# Patient Record
Sex: Male | Born: 1990 | Hispanic: Yes | Smoking: Never smoker
Health system: Southern US, Community
[De-identification: ages and names within clinical notes are randomized; demographics above are authoritative.]

## PROBLEM LIST (undated history)

## (undated) DIAGNOSIS — J45909 Unspecified asthma, uncomplicated: Secondary | ICD-10-CM

## (undated) HISTORY — PX: BLADDER STONE REMOVAL: SHX568

---

## 2011-12-05 ENCOUNTER — Ambulatory Visit: Payer: Self-pay | Admitting: Family Medicine

## 2012-01-20 ENCOUNTER — Ambulatory Visit: Payer: Self-pay

## 2012-01-20 LAB — COMPREHENSIVE METABOLIC PANEL
Alkaline Phosphatase: 292 U/L — ABNORMAL HIGH (ref 50–136)
BUN: 9 mg/dL (ref 7–18)
Chloride: 99 mmol/L (ref 98–107)
Creatinine: 0.96 mg/dL (ref 0.60–1.30)
EGFR (African American): >60
EGFR (Non-African Amer.): >60
Osmolality: 274 (ref 275–301)
SGPT (ALT): 530 U/L — ABNORMAL HIGH
Sodium: 138 mmol/L (ref 136–145)
Total Protein: 8.1 g/dL (ref 6.4–8.2)

## 2012-01-20 LAB — CBC WITH DIFFERENTIAL/PLATELET
Basophil #: 0 10*3/uL (ref 0.0–0.1)
HCT: 48.2 % (ref 40.0–52.0)
Lymphocyte #: 0.8 10*3/uL — ABNORMAL LOW (ref 1.0–3.6)
MCH: 30.5 pg (ref 26.0–34.0)
MCHC: 33 g/dL (ref 32.0–36.0)
MCV: 92 fL (ref 80–100)
Platelet: 182 10*3/uL (ref 150–440)
RBC: 5.21 10*6/uL (ref 4.40–5.90)
RDW: 13.2 % (ref 11.5–14.5)

## 2012-01-20 LAB — URINALYSIS, COMPLETE
Bacteria: NEGATIVE
Glucose,UR: NEGATIVE mg/dL (ref 0–75)
Leukocyte Esterase: NEGATIVE
Nitrite: NEGATIVE
Ph: 5 (ref 4.5–8.0)
Specific Gravity: 1.025 (ref 1.003–1.030)
Squamous Epithelial: NONE SEEN

## 2012-01-20 LAB — LIPASE, BLOOD: Lipase: 155 U/L (ref 73–393)

## 2012-01-21 ENCOUNTER — Ambulatory Visit: Payer: Self-pay | Admitting: Internal Medicine

## 2012-01-21 ENCOUNTER — Ambulatory Visit: Payer: Self-pay

## 2012-01-21 ENCOUNTER — Inpatient Hospital Stay: Payer: Self-pay | Admitting: General Surgery

## 2012-01-21 LAB — COMPREHENSIVE METABOLIC PANEL
Albumin: 3.9 g/dL (ref 3.4–5.0)
Alkaline Phosphatase: 290 U/L — ABNORMAL HIGH (ref 50–136)
Bilirubin,Total: 2.5 mg/dL — ABNORMAL HIGH (ref 0.2–1.0)
Calcium, Total: 9.3 mg/dL (ref 8.5–10.1)
Creatinine: 0.98 mg/dL (ref 0.60–1.30)
EGFR (Non-African Amer.): >60
Osmolality: 276 (ref 275–301)
SGOT(AST): 210 U/L — ABNORMAL HIGH (ref 15–37)
Sodium: 139 mmol/L (ref 136–145)
Total Protein: 7.6 g/dL (ref 6.4–8.2)

## 2012-01-21 LAB — LIPASE, BLOOD: Lipase: 266 U/L (ref 73–393)

## 2012-01-21 LAB — CBC WITH DIFFERENTIAL/PLATELET
Basophil #: 0 10*3/uL (ref 0.0–0.1)
Eosinophil #: 0.2 10*3/uL (ref 0.0–0.7)
Lymphocyte %: 26.1 %
MCH: 30.8 pg (ref 26.0–34.0)
Monocyte #: 0.5 10*3/uL (ref 0.0–0.7)
Neutrophil %: 53.6 %
RDW: 13.5 % (ref 11.5–14.5)
WBC: 3.6 10*3/uL — ABNORMAL LOW (ref 3.8–10.6)

## 2012-01-21 LAB — PROTIME-INR: Prothrombin Time: 11.9 secs (ref 11.5–14.7)

## 2012-01-23 LAB — HEPATIC FUNCTION PANEL A (ARMC)
Albumin: 3.3 g/dL — ABNORMAL LOW (ref 3.4–5.0)
Alkaline Phosphatase: 216 U/L — ABNORMAL HIGH (ref 50–136)
SGOT(AST): 383 U/L — ABNORMAL HIGH (ref 15–37)
SGPT (ALT): 672 U/L — ABNORMAL HIGH
Total Protein: 6.6 g/dL (ref 6.4–8.2)

## 2012-04-18 ENCOUNTER — Ambulatory Visit: Payer: Self-pay | Admitting: Medical

## 2012-08-02 ENCOUNTER — Ambulatory Visit: Payer: Self-pay | Admitting: Internal Medicine

## 2012-10-31 ENCOUNTER — Ambulatory Visit: Payer: Self-pay | Admitting: Family Medicine

## 2013-08-23 ENCOUNTER — Ambulatory Visit: Payer: Self-pay | Admitting: Physician Assistant

## 2014-02-25 ENCOUNTER — Emergency Department: Payer: Self-pay | Admitting: Internal Medicine

## 2015-02-05 NOTE — Consult Note (Signed)
PATIENT NAME:  Troy Pittman, Troy Pittman MR#:  [id number] DATE OF BIRTH:  05-29-1991  DATE OF CONSULTATION:  01/22/2012  REFERRING PHYSICIAN:  Duwaine Maxin, MD  CONSULTING PHYSICIAN:  Lurline Del, MD  REASON FOR CONSULTATION: Choledocholithiasis.   HISTORY OF PRESENT ILLNESS: The patient is a 24 year old male who has been in his normal state of health until a few days ago when he started to have sudden onset of upper abdominal pain. It appears the patient has had some episodes of abdominal pain in the past as well but each one of them was short-lived and resolved on its own. Lately the patient has been having weakness as well as generalized body ache. He has been noticing his urine to be dark. He went to Urgent Care where an ultrasound was done which showed dilated common bile duct as well as what appears to be a filling defect in the distal common bile duct raising concerns about possible impacted stone. The patient was seen by Dr. Anda Kraft in his office and was admitted for further management. The patient was evaluated yesterday in the presence of his mother. He was feeling well at that point without significant abdominal pain. He appears to be mildly jaundiced. Denies any fever or chills.   PAST MEDICAL HISTORY: Fairly unremarkable.   ALLERGIES: Penicillin.   CURRENT MEDICATIONS:  1. Tylenol.  2. Cefazolin.   3. Morphine. 4. Promethazine.   FAMILY HISTORY: Unremarkable.   REVIEW OF SYSTEMS: Negative for fever, chills, or night sweats. Positive for intermittent abdominal pain as well as generalized body ache and muscle weakness. Denies any vomiting. Review of systems is also positive for dark urine but no change in the color of his stools.     PHYSICAL EXAMINATION:   GENERAL: Very well built male who does not appear to be in any acute distress, fully awake, alert and oriented.   VITAL SIGNS: Afebrile with a temperature around 98.7, blood pressure 120/80.   HEENT: Unremarkable except  for very mild jaundice.   NECK: Neck veins are flat.   LUNGS: Grossly clear to auscultation bilaterally with fair air entry. No added sounds.   CARDIOVASCULAR: Regular rate and rhythm. No gallops or murmur.   ABDOMEN: Abdomen is fairly benign. No significant right upper quadrant or epigastric tenderness was noted. Bowel sounds are positive. No hepatomegaly was noted as well.   EXTREMITIES: No edema.   NEUROLOGIC: Appears to be unremarkable.   LABORATORY, DIAGNOSTIC, AND RADIOLOGICAL DATA: White cell count on admission 6.8; today 3.6, hemoglobin 15.9, hematocrit 48. Chemistries are fine although his liver enzymes are abnormal with total bilirubin 2.5, alkaline phosphatase 290, ALT 546, AST 210. Serum amylase and lipase are normal. Ultrasound of the abdomen as above.   ASSESSMENT AND PLAN: The patient is with abnormal liver enzymes, intermittent abdominal pain, mild jaundice and what appears to be a stone in the distal common bile duct on ultrasound. I agree with current management with intravenous antibiotics. An ERCP was recommended and agreed upon by the patient and his mother. The patient underwent an ERCP today where a large stone was noted in the distal common bile duct. A generous sphincterotomy was made and stone was extracted. The patient tolerated the procedure well. The patient has been re-evaluated a couple of hours after the procedure. Denies any abdominal pain, nausea or vomiting.   RECOMMENDATIONS:  1. Continue antibiotics. 2. Repeat liver enzymes in a.m.  3. I will start him on a clear liquid diet today which can be gradually  advanced if he remains asymptomatic.  4. I am not sure if the patient would require a cholecystectomy as no other stones were noted within the gallbladder. I will leave this decision up to Dr. Anda KraftMarterre.  5. Further recommendations to follow.   ____________________________ Lurline DelShaukat Diksha Tagliaferro, MD si:drc D: 01/22/2012 18:12:26 ET T: 01/23/2012 10:14:07  ET JOB#: [id number]  cc: Lurline DelShaukat Lijah Bourque, MD, <Dictator> Lurline DelSHAUKAT Ruvim Risko MD ELECTRONICALLY SIGNED 01/24/2012 16:33

## 2015-02-05 NOTE — Discharge Summary (Signed)
PATIENT NAME:  Logan BoresCHECO, Vashaun H MR#:  [id number] DATE OF BIRTH:  [birth year]  DATE OF ADMISSION:  01/21/2012 DATE OF DISCHARGE:  01/23/2012  DISCHARGE DIAGNOSIS: Biliary obstruction secondary to common bile duct stone.  CLINICAL NOTE: This 24 year old male was well until three days prior to admission when he developed pain in the epigastrium. Associated with this was a general overall sense of myalgias and weakness. Over the next 24 hours he noted dark urine and nausea. Recent ultrasound showed evidence of a common bile duct stone, biliary dilatation, and laboratory studies showing an elevated total bilirubin of 2.5. He was admitted with the idea that a common duct stone was producing his symptoms.   The patient was seen in consultation by Lurline DelShaukat Iftikhar, MD from the GI service. He subsequently underwent an ERCP on 01/22/2012 which time a common bile duct stone was retrieved. Of note, on preoperative ultrasound and on ERCP images no evidence of gallstones was seen.   The patient remained asymptomatic postadmission and postprocedure. He was able to tolerate clear liquids the evening of surgery and was advanced to a soft diet which he tolerated without difficulty.   Preprocedure the bilirubin was 2.5, postprocedure 1.5 with a direct component of 0.7. There was a persistent mild elevation of serum transaminases and alkaline phosphatase postprocedure. The patient, however, was asymptomatic and felt to be a candidate for discharge home.   Arrangements were made for outpatient follow-up in six weeks with a repeat ultrasound to determine whether elective cholecystectomy would be of benefit.   ____________________________ Earline MayotteJeffrey W. Byrnett, MD jwb:drc D: 02/05/2012 10:09:57 ET T: 02/05/2012 10:54:12 ET JOB#: [id number]  cc: Earline MayotteJeffrey W. Byrnett, MD, <Dictator>, Lurline DelShaukat Iftikhar, MD, Reginold AgentEugene H. Thurmond ButtsWade, MD JEFFREY Brion AlimentW BYRNETT MD ELECTRONICALLY SIGNED 02/06/2012 16:18

## 2015-02-05 NOTE — Consult Note (Signed)
Brief Consult Note: Diagnosis: CBD stone.   Orders entered.   Discussed with Attending MD.   Comments: Ptaient with episodic abdominal pain, CBD stone on US and abnormal LFT's along with dilated biliary tree. No signs of cholangitis.  Recommendations: Agree with IV abx. ERCP with stone extraction or stent placement. The procedure was discussed with him and his mother in detail. The complications associated with the procedure including but not limited to post ERCP pancreatitis were discussed as well along with the fact that some cases of pancreatitis may be fatal. Limitations of the procedure as well as alternatives, such as surgery, were discussed as well. They are in full agreement. Will proceed with the ERCP tomorrow.  Electronic Signatures: Lurline DelIftikhar, Layden Caterino (MD)  (Signed 09-Apr-13 23:17)  Authored: Brief Consult Note   Last Updated: 09-Apr-13 23:17 by Lurline DelIftikhar, Gunther Zawadzki (MD)

## 2015-02-05 NOTE — H&P (Signed)
PATIENT NAME:  Troy BoresCHECO, [name] MR#:  [id number] DATE OF BIRTH:  1991-05-19  DATE OF ADMISSION:  01/21/2012  ADMISSION DIAGNOSIS: Choledocholithiasis.   CLINICAL NOTE: This 24 year old male was in his usual state of health until 04/06 when he experienced the onset of pain in the epigastrium. This passed rapidly but during this time he felt overall myalgias and weakness. At this time he first noted that his urine had become dark in color. Nausea has been intermittent since that time. He was evaluated at the urgent care center yesterday and noted to have mild elevation of his liver functions. An ultrasound was arranged for today showing evidence of choledocholithiasis. He was referred for evaluation. His examination was initially completed in the office in the company of his mother, Troy Pittman.  The patient reports that he has had intermittent episodes of similar pain, most recently in March of this year when it lasted 1 to 2 days, again associated with dark urine and in July 2012. Apparently other episodes have occurred in the past. He has not appreciated any fever or chills. He reports no change in his bowel function.   PHYSICAL EXAMINATION:  HEAD AND NECK: Examination showed mild scleral icterus. Mucous membranes were dry. No cervical adenopathy or thyromegaly.   CHEST: Clear to auscultation.   CARDIAC: Regular rhythm without murmur or gallop.   ABDOMEN: The abdomen was nondistended. Bowel sounds were normoactive. No bruits noted. Moderate tenderness in the epigastrium and right upper quadrant was appreciated. No peritoneal irritation, guarding, or referred pain. No inguinal adenopathy. No lower extremity edema.   LABORATORY, DIAGNOSTIC AND RADIOLOGICAL DATA: Laboratory studies obtained yesterday showed a normal white count and differential. Follow up today showed a moderate neutropenia. Hemoglobin unchanged at 15.9. Serum transaminases are elevated with an SGOT of 169 rising to 210  today and a fairly stable SGPT at 530, rising to 546. Alkaline phosphatase is modestly elevated at 290. Bilirubin has increased from yesterday's value of 1.6 to 2.5 today. Clotting studies are normal. Urinalysis shows bilirubin within the urine with specific gravity of 1.025. Ultrasound showed evidence of choledocholithiasis with dilatation of the common bile duct and intrahepatic ducts. No stones were identified within the gallbladder.   ASSESSMENT AND PLAN: The young man's general health is excellent. His intermittent episodes of abdominal pain are likely secondary to choledocholithiasis. At this time there is no evidence of gallbladder stones.   Consultation has been obtained with Dr. Niel HummerIftikhar for ERCP and stone extraction.   The patient will be placed on antibiotics pending decompression of the bile duct.   ____________________________ Earline MayotteJeffrey W. Charlesetta Milliron, MD jwb:cms D: 01/21/2012 20:23:17 ET T: 01/22/2012 06:19:22 ET JOB#: [id number]  cc: Earline MayotteJeffrey W. Jamespaul Secrist, MD, <Dictator> Lurline DelShaukat Iftikhar, MD Reginold AgentEugene H. Thurmond ButtsWade, MD Porter Moes Brion AlimentW Vang Kraeger MD ELECTRONICALLY SIGNED 01/24/2012 10:20

## 2016-02-21 IMAGING — CR DG SHOULDER 3+V*L*
1 series · 4 of 4 positions shown · non-contrast
Comparison: None.

CLINICAL DATA: Pain secondary to repetitive motion/lifting, now
painful 12 plaque force none

EXAM:
DG SHOULDER 3+VIEWS LEFT

[Series 1: w shoulder grashey left · 0.14mm/px · 4 of 4 slices shown]
[im 1/4]
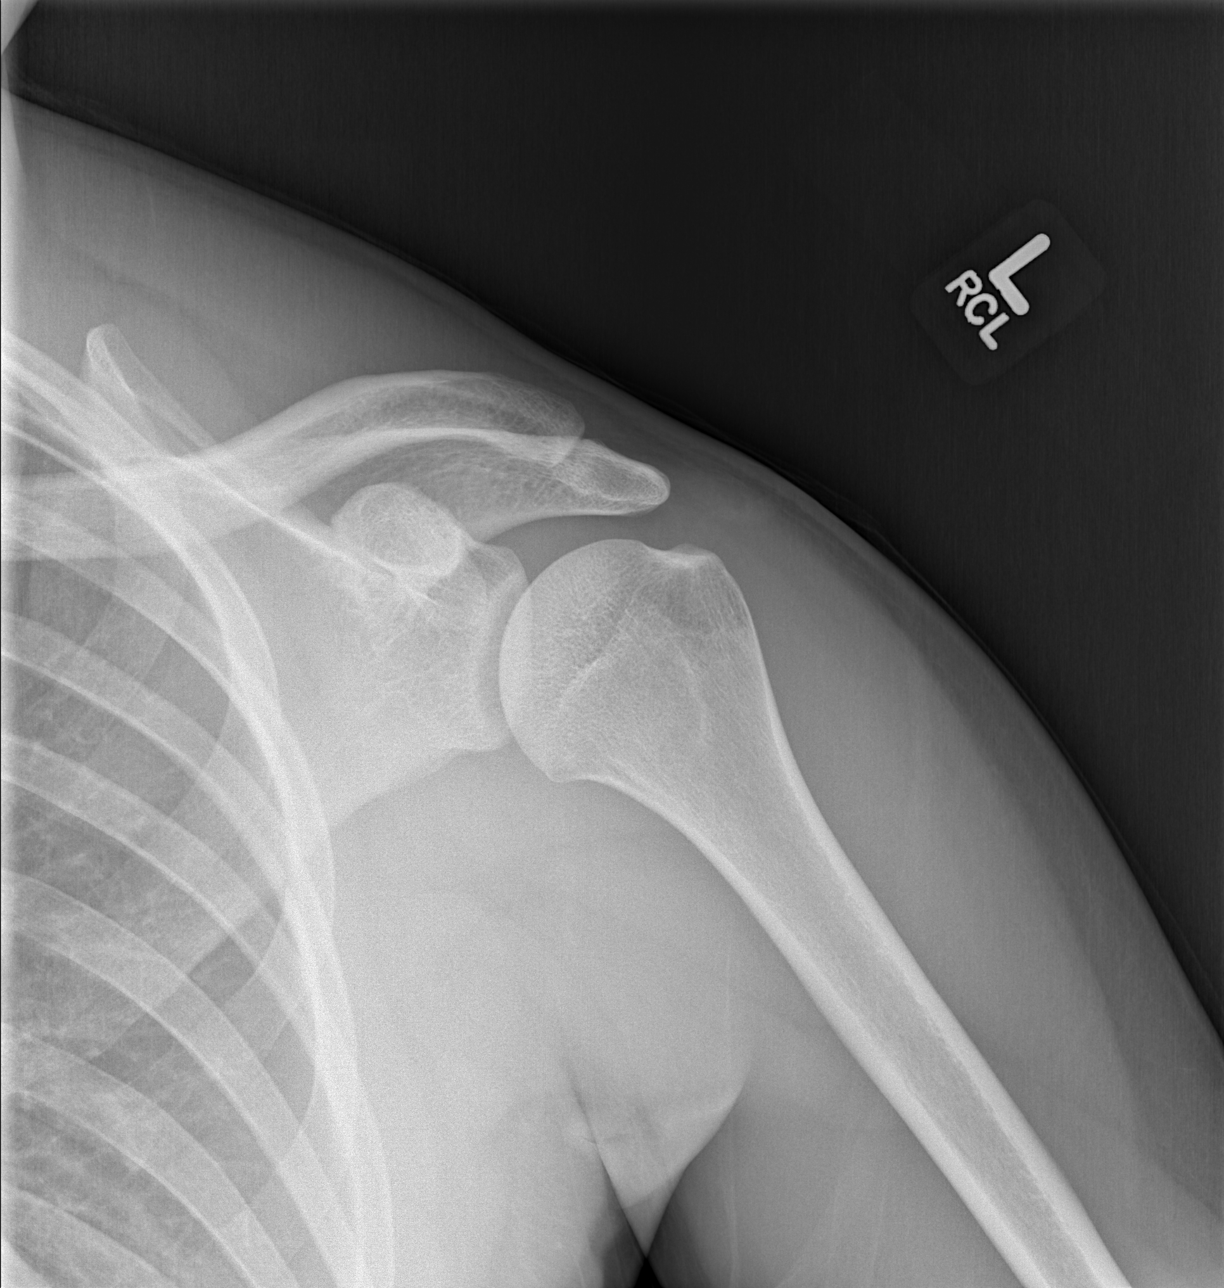
[im 2/4]
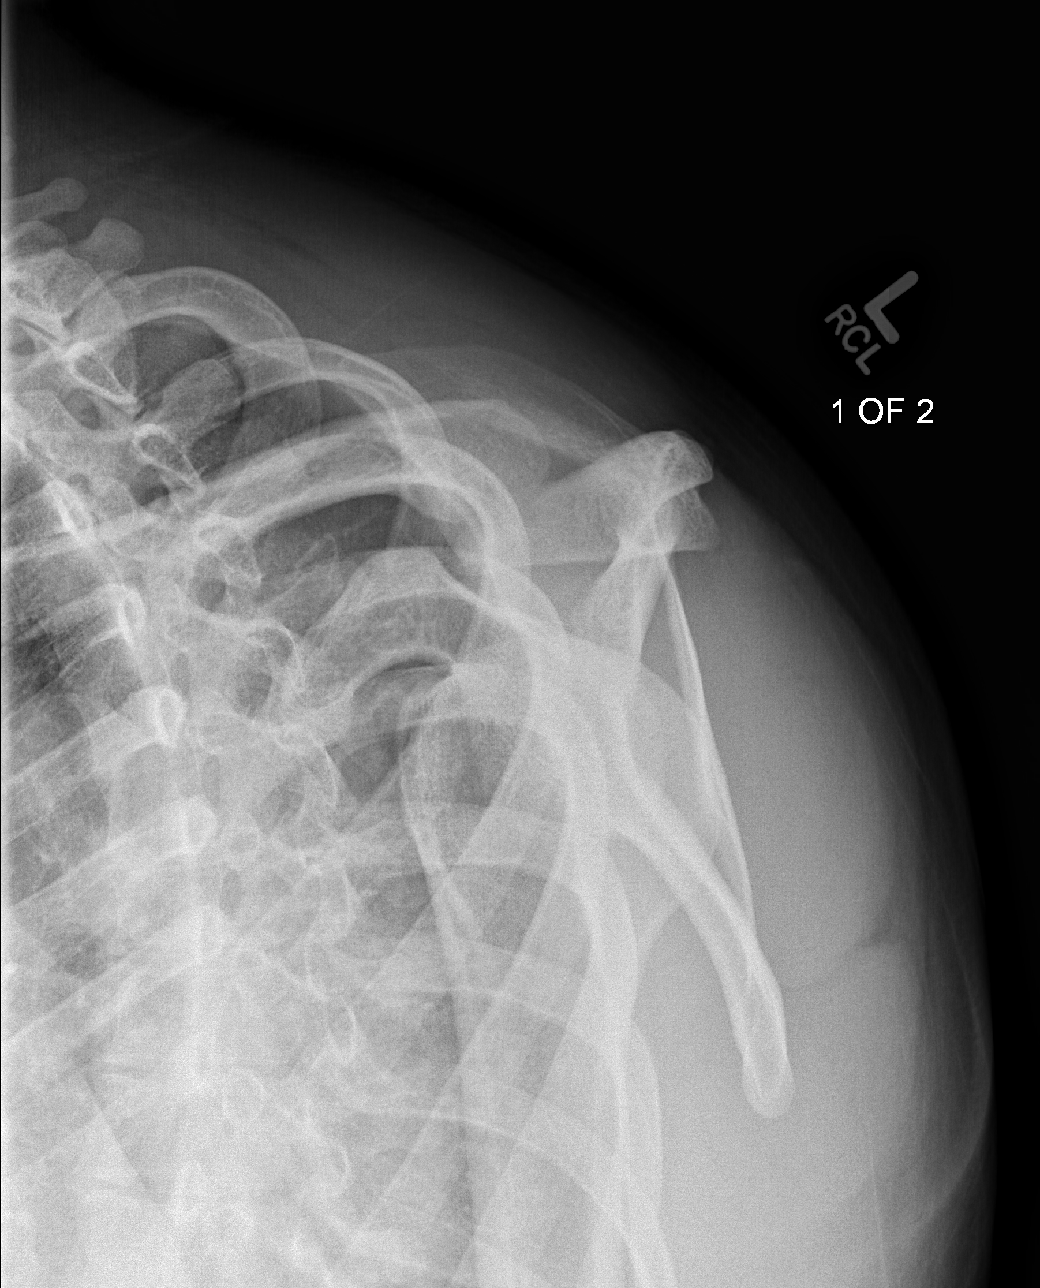
[im 3/4]
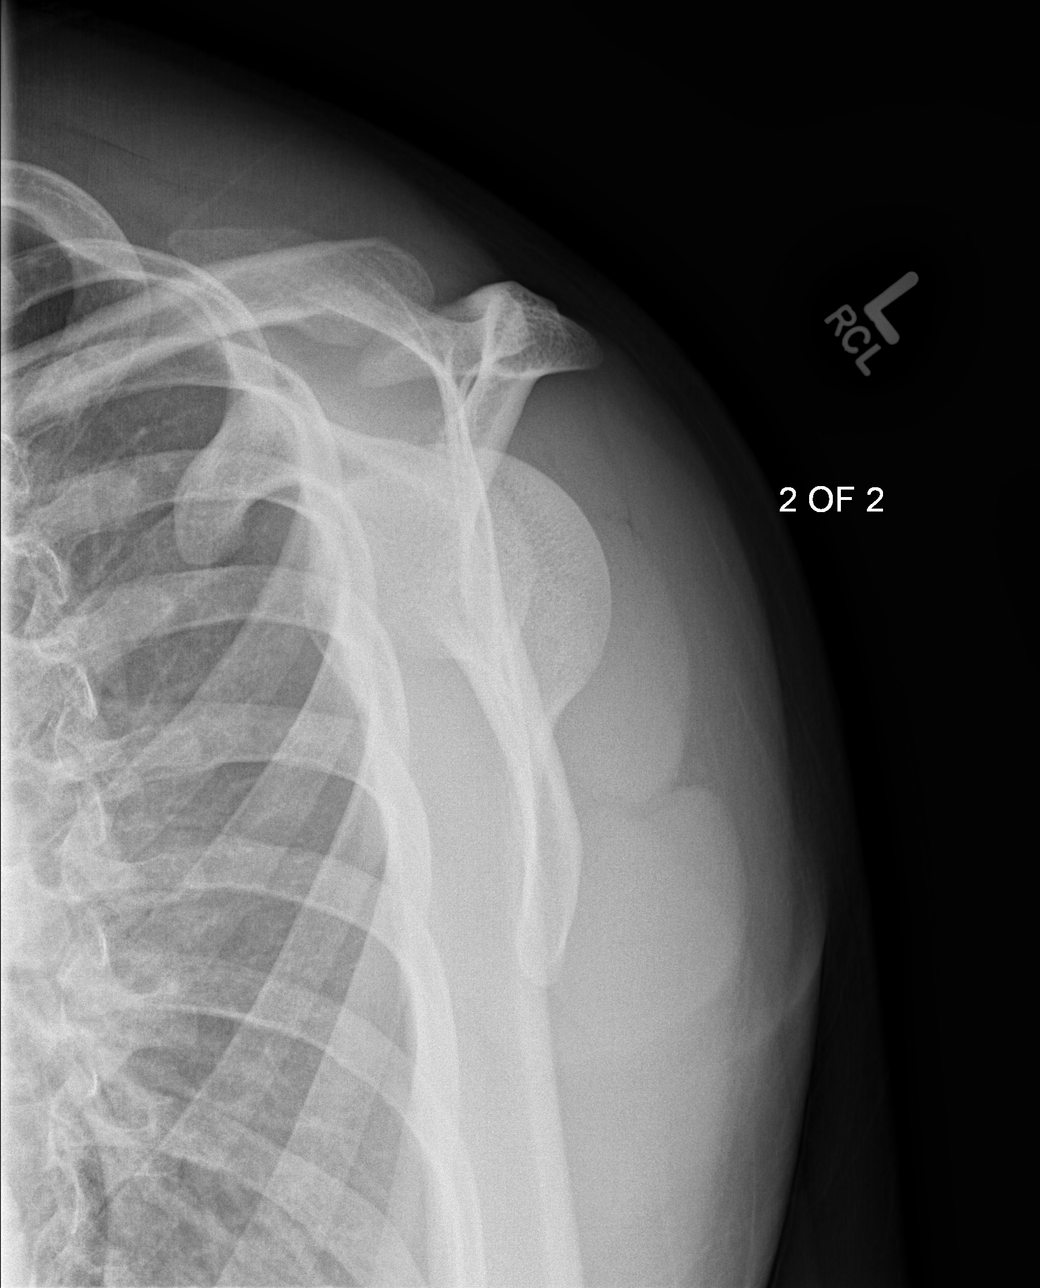
[im 4/4]
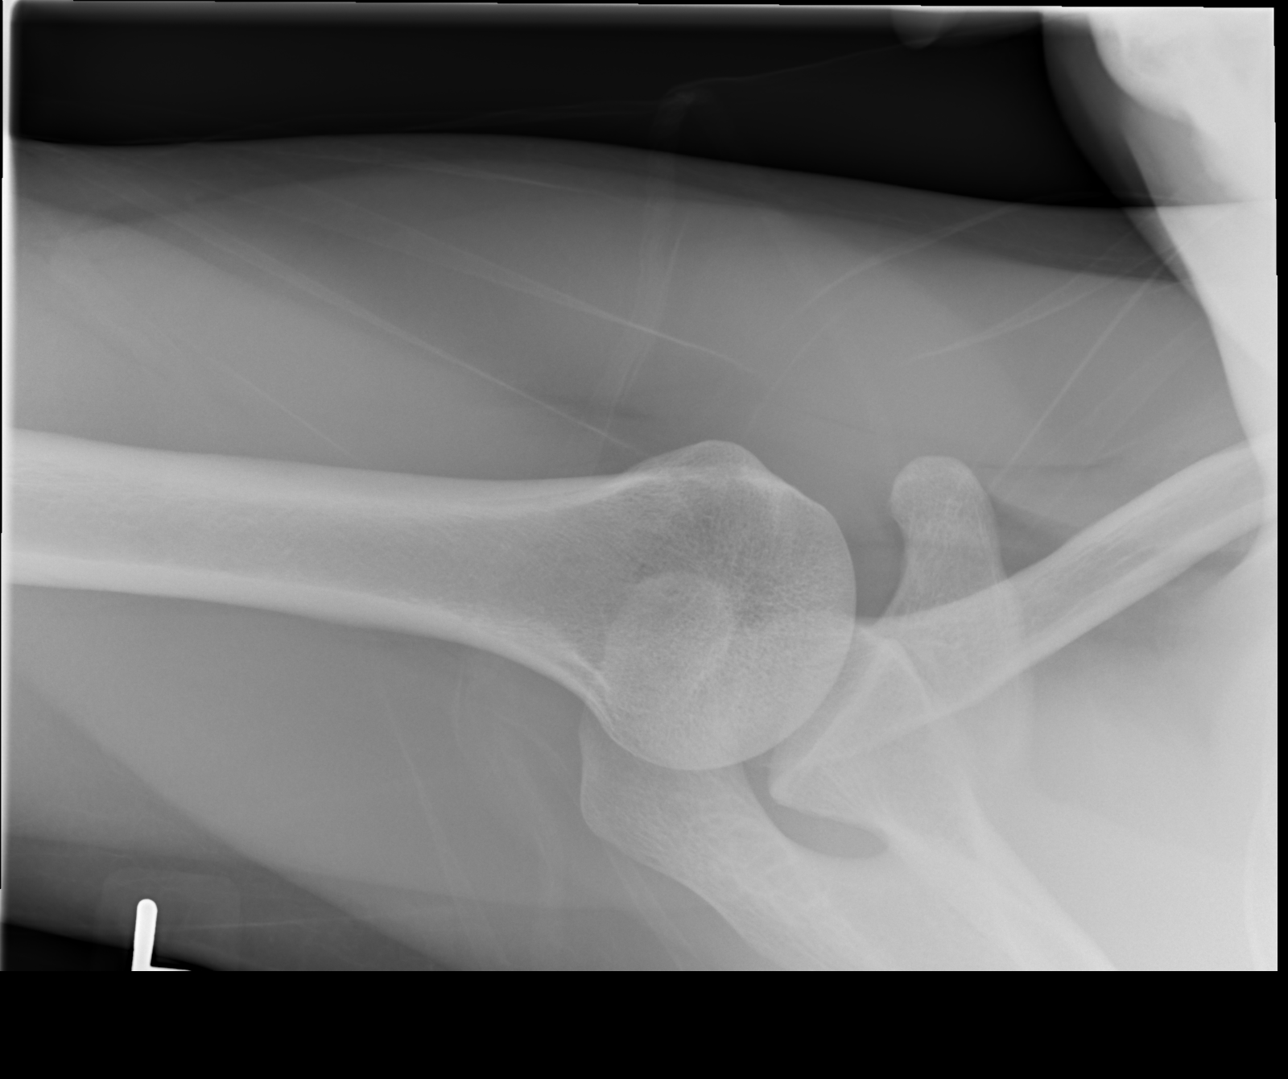

[4 of 4 positions shown; findings below may reference images not displayed]

FINDINGS: Osseous mineralization normal.

AC joint alignment normal.

No acute fracture, dislocation or bone destruction.

Visualized ribs unremarkable.
IMPRESSION: Normal exam.

## 2016-07-17 ENCOUNTER — Emergency Department: Payer: Self-pay

## 2016-07-17 ENCOUNTER — Emergency Department
Admission: EM | Admit: 2016-07-17 | Discharge: 2016-07-17 | Disposition: A | Discharge status: Home / Self Care | Payer: Self-pay | Attending: Emergency Medicine | Admitting: Emergency Medicine

## 2016-07-17 ENCOUNTER — Encounter: Payer: Self-pay | Admitting: *Deleted

## 2016-07-17 DIAGNOSIS — R5383 Other fatigue: Secondary | ICD-10-CM | POA: Insufficient documentation

## 2016-07-17 LAB — COMPREHENSIVE METABOLIC PANEL
ALK PHOS: 87 U/L (ref 38–126)
ALT: 11 U/L — AB (ref 17–63)
ANION GAP: 5 (ref 5–15)
AST: 16 U/L (ref 15–41)
Albumin: 3.9 g/dL (ref 3.5–5.0)
BILIRUBIN TOTAL: 0.6 mg/dL (ref 0.3–1.2)
BUN: 18 mg/dL (ref 6–20)
CALCIUM: 8.8 mg/dL — AB (ref 8.9–10.3)
CO2: 31 mmol/L (ref 22–32)
CREATININE: 1.02 mg/dL (ref 0.61–1.24)
Chloride: 102 mmol/L (ref 101–111)
Glucose, Bld: 76 mg/dL (ref 65–99)
Potassium: 3.8 mmol/L (ref 3.5–5.1)
Sodium: 138 mmol/L (ref 135–145)
TOTAL PROTEIN: 7.2 g/dL (ref 6.5–8.1)

## 2016-07-17 LAB — CBC WITH DIFFERENTIAL/PLATELET
Basophils Absolute: 0.1 10*3/uL (ref 0–0.1)
Basophils Relative: 1 %
EOS ABS: 0.5 10*3/uL (ref 0–0.7)
Eosinophils Relative: 6 %
HEMATOCRIT: 46.1 % (ref 40.0–52.0)
HEMOGLOBIN: 15.4 g/dL (ref 13.0–18.0)
LYMPHS ABS: 1.5 10*3/uL (ref 1.0–3.6)
LYMPHS PCT: 16 %
MCH: 30.2 pg (ref 26.0–34.0)
MCHC: 33.5 g/dL (ref 32.0–36.0)
MCV: 90.1 fL (ref 80.0–100.0)
MONOS PCT: 10 %
Monocytes Absolute: 0.9 10*3/uL (ref 0.2–1.0)
NEUTROS ABS: 6.3 10*3/uL (ref 1.4–6.5)
NEUTROS PCT: 67 %
Platelets: 223 10*3/uL (ref 150–440)
RBC: 5.11 MIL/uL (ref 4.40–5.90)
RDW: 13.7 % (ref 11.5–14.5)
WBC: 9.3 10*3/uL (ref 3.8–10.6)

## 2016-07-17 LAB — URINALYSIS COMPLETE WITH MICROSCOPIC (ARMC ONLY)
Bacteria, UA: NONE SEEN
Bilirubin Urine: NEGATIVE
GLUCOSE, UA: NEGATIVE mg/dL
Hgb urine dipstick: NEGATIVE
Ketones, ur: NEGATIVE mg/dL
LEUKOCYTES UA: NEGATIVE
Nitrite: NEGATIVE
PROTEIN: NEGATIVE mg/dL
Specific Gravity, Urine: 1.017 (ref 1.005–1.030)
WBC, UA: NONE SEEN WBC/hpf (ref 0–5)
pH: 6 (ref 5.0–8.0)

## 2016-07-17 NOTE — ED Triage Notes (Addendum)
Pt states he feels tired.  No n/v/d.  No cough.  Pt has runny nose. Pt has nasal congestion.   Pt alert.  Speech clear.

## 2016-07-17 NOTE — ED Notes (Addendum)
Pt reports that for 2 weeks he has been weak and fatigued - pt reports appetite is still good - yesterday started with nasal congestion and "allergies" - reports aches in muscles and having to take naps but no relief - denies nausea/vomiting/diarrhea - pt states he only has the energy to lay down all day

## 2016-07-17 NOTE — ED Provider Notes (Signed)
[facility] Emergency Department Provider Note ____________________________________________  Time seen: 2021  I have reviewed the triage vital signs and the nursing notes.  HISTORY  Chief Complaint  Fatigue  HPI Troy Pittman is a 25 y.o. male without any significant medical history presents to the ED with a 2 week complaint of generalized fatigue and weakness. Patient describes that his symptoms. Worse in the a.m. He denies any recent travel, sick contacts, medications, exposures, or injuries. He is a developed some sinus nasal drainage in the last 24 hours. He reports a good appetite and denies any acute nausea, vomiting, or diarrhea. 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 He denies any occupational or environmental exposures. His only medical history is for resolved childhood asthma. He has no current medications that he takes. He denies any tick bites, rashes, or other illness. He describes even after he he feels like he is still tired. He has not missed any work today to his symptoms and denies any other complaints at this time.  No past medical history on file.  There are no active problems to display for this patient.  No past surgical history on file.  Prior to Admission medications   Not on File   Allergies Review of patient's allergies indicates no known allergies.  No family history on file.  Social History Social History  Substance Use Topics  . Smoking status: Never Smoker  . Smokeless tobacco: Never Used  . Alcohol use No    Review of Systems  Constitutional: Negative for fever. Reports general fatigue as above.  Eyes: Negative for visual changes. ENT: Negative for sore throat. Cardiovascular: Negative for chest pain. Respiratory: Negative for shortness of breath. Gastrointestinal: Negative for abdominal pain, vomiting and diarrhea. Genitourinary: Negative for  dysuria. Musculoskeletal: Negative for back pain. Skin: Negative for rash. Neurological: Negative for headaches, focal weakness or numbness. ____________________________________________  PHYSICAL EXAM:  VITAL SIGNS: ED Triage Vitals  Enc Vitals Group     BP 07/17/16 1913 (!) 129/48     Pulse Rate 07/17/16 1913 (!) 58     Resp 07/17/16 1913 18     Temp 07/17/16 1913 98.3 F (36.8 C)     Temp Source 07/17/16 1913 Oral     SpO2 07/17/16 1913 100 %     Weight 07/17/16 1913 186 lb (84.4 kg)     Height 07/17/16 1913 6\' 1"  (1.854 m)     Head Circumference --      Peak Flow --      Pain Score 07/17/16 2005 0     Pain Loc --      Pain Edu? --      Excl. in GC? --     Constitutional: Alert and oriented. Well appearing and in no distress. Head: Normocephalic and atraumatic.      Eyes: Conjunctivae are normal. PERRL. Normal extraocular movements      Ears: Canals clear. TMs intact bilaterally.   Nose: No congestion/rhinorrhea.   Mouth/Throat: Mucous membranes are moist.   Neck: Supple. No thyromegaly. Hematological/Lymphatic/Immunological: No cervical lymphadenopathy. Cardiovascular: Normal rate, regular rhythm.  Respiratory: Normal respiratory effort. No wheezes/rales/rhonchi. Gastrointestinal: Soft and nontender. No distention. Musculoskeletal: Nontender with normal range of motion in all extremities.  Neurologic:  Normal gait without ataxia. Normal speech and language. No gross focal neurologic deficits are appreciated. Skin:  Skin is warm, dry and intact. No rash noted. Psychiatric: Mood and affect  are normal. Patient exhibits appropriate insight and judgment. ____________________________________________   LABS (pertinent positives/negatives) Labs Reviewed  URINALYSIS COMPLETEWITH MICROSCOPIC ([facility] ONLY) - Abnormal; Notable for the following:       Result Value   Color, Urine YELLOW (*)    APPearance CLEAR (*)    Squamous Epithelial / LPF 0-5 (*)    All other  components within normal limits  COMPREHENSIVE METABOLIC PANEL - Abnormal; Notable for the following:    Calcium 8.8 (*)    ALT 11 (*)    All other components within normal limits  CBC WITH DIFFERENTIAL/PLATELET  B. BURGDORFI ANTIBODIES  ____________________________________________   RADIOLOGY  CXR IMPRESSION:  No active cardiopulmonary disease.  I, Monte Zinni, Charlesetta Ivory, personally viewed and evaluated these images (plain radiographs) as part of my medical decision making, as well as reviewing the written report by the radiologist. ____________________________________________  INITIAL IMPRESSION / ASSESSMENT AND PLAN / ED COURSE  Patient with a benign exam and reassuring labs and chest x-ray following a 2 week complaint of generalized fatigue and weakness. The patient is reassured by his normal labs and chest x-ray as well. He is at this time concurs file a local [place] clinic for ongoing symptom management. A Lyme titer is pending at time of discharge. Patient should return to the ED for any acutely worsening symptoms as discussed.  Clinical Course   ____________________________________________  FINAL CLINICAL IMPRESSION(S) / ED DIAGNOSES  Final diagnoses:  Fatigue, unspecified type      Lissa Hoard, PA-C 07/18/16 0000    Nita Sickle, MD 07/19/16 [phone]

## 2016-07-17 NOTE — Discharge Instructions (Signed)
Your chest x-ray, labs and exam are all normal and reassuring at this time. Your blood test for tickborne disease is pending at the time of your discharge. Follow-up with one of the community clinics for routine medical care and management of ongoing symptoms.

## 2016-07-19 LAB — B. BURGDORFI ANTIBODIES: B burgdorferi Ab IgG+IgM: <0.91 {ISR} (ref 0.00–0.90)

## 2017-05-08 ENCOUNTER — Encounter: Payer: Self-pay | Admitting: Emergency Medicine

## 2017-05-08 ENCOUNTER — Emergency Department
Admission: EM | Admit: 2017-05-08 | Discharge: 2017-05-08 | Disposition: A | Discharge status: Home / Self Care | Payer: Self-pay | Attending: Student in an Organized Health Care Education/Training Program | Admitting: Student in an Organized Health Care Education/Training Program

## 2017-05-08 ENCOUNTER — Emergency Department: Payer: Self-pay

## 2017-05-08 DIAGNOSIS — F430 Acute stress reaction: Secondary | ICD-10-CM | POA: Insufficient documentation

## 2017-05-08 DIAGNOSIS — F439 Reaction to severe stress, unspecified: Secondary | ICD-10-CM

## 2017-05-08 DIAGNOSIS — R079 Chest pain, unspecified: Secondary | ICD-10-CM

## 2017-05-08 LAB — BASIC METABOLIC PANEL
Anion gap: 8 (ref 5–15)
BUN: 15 mg/dL (ref 6–20)
CALCIUM: 8.8 mg/dL — AB (ref 8.9–10.3)
CO2: 28 mmol/L (ref 22–32)
CREATININE: 0.99 mg/dL (ref 0.61–1.24)
Chloride: 104 mmol/L (ref 101–111)
Glucose, Bld: 98 mg/dL (ref 65–99)
Potassium: 3.7 mmol/L (ref 3.5–5.1)
SODIUM: 140 mmol/L (ref 135–145)

## 2017-05-08 LAB — TROPONIN I

## 2017-05-08 LAB — CBC
HCT: 46.6 % (ref 40.0–52.0)
Hemoglobin: 15.5 g/dL (ref 13.0–18.0)
MCH: 30.6 pg (ref 26.0–34.0)
MCHC: 33.3 g/dL (ref 32.0–36.0)
MCV: 91.8 fL (ref 80.0–100.0)
PLATELETS: 207 10*3/uL (ref 150–440)
RBC: 5.07 MIL/uL (ref 4.40–5.90)
RDW: 13.3 % (ref 11.5–14.5)
WBC: 6.1 10*3/uL (ref 3.8–10.6)

## 2017-05-08 NOTE — ED Provider Notes (Signed)
[facility] Emergency Department Provider Note    First MD Initiated Contact with Patient 05/08/17 2120     (approximate)  I have reviewed the triage vital signs and the nursing notes.   HISTORY  Chief Complaint Chest Pain    HPI Troy Pittman is a 26 y.o. male presents with midsternal burning chest pain that occurred in the patient woke from a nap. Patient states the pain is nonradiating. Described as a pressure. No pain radiating to his jaw or through to his back. Denies any nausea or diaphoresis. States that he has had similar chest pains when he gets angry or stressed. Denies any shortness of breath or pleuritic chest pain. No change in the character of pain with movement or position.   History reviewed. No pertinent past medical history. History reviewed. No pertinent family history. History reviewed. No pertinent surgical history. There are no active problems to display for this patient.     Prior to Admission medications   Not on File    Allergies Penicillins    Social History Social History  Substance Use Topics  . Smoking status: Never Smoker  . Smokeless tobacco: Never Used  . Alcohol use No    Review of Systems Patient denies headaches, rhinorrhea, blurry vision, numbness, shortness of breath, chest pain, edema, cough, abdominal pain, nausea, vomiting, diarrhea, dysuria, fevers, rashes or hallucinations unless otherwise stated above in HPI. ____________________________________________   PHYSICAL EXAM:  VITAL SIGNS: Vitals:   05/08/17 1855  BP: (!) 138/55  Pulse: (!) 55  Resp: 18  Temp: 98.7 F (37.1 C)    Constitutional: Alert and oriented. Well appearing and in no acute distress. Eyes: Conjunctivae are normal.  Head: Atraumatic. Nose: No congestion/rhinnorhea. Mouth/Throat: Mucous membranes are moist.   Neck: No stridor. Painless ROM.  Cardiovascular: Normal rate, regular rhythm. Grossly normal heart sounds.   Good peripheral circulation. Respiratory: Normal respiratory effort.  No retractions. Lungs CTAB. Gastrointestinal: Soft and nontender. No distention. No abdominal bruits. No CVA tenderness. Genitourinary:  Musculoskeletal: No lower extremity tenderness nor edema.  No joint effusions. Neurologic:  Normal speech and language. No gross focal neurologic deficits are appreciated. No facial droop Skin:  Skin is warm, dry and intact. No rash noted. Psychiatric: Mood and affect are normal. Speech and behavior are normal.  ____________________________________________   LABS (all labs ordered are listed, but only abnormal results are displayed)  Results for orders placed or performed during the hospital encounter of 05/08/17 (from the past 24 hour(s))  Basic metabolic panel     Status: Abnormal   Collection Time: 05/08/17  7:00 PM  Result Value Ref Range   Sodium 140 135 - 145 mmol/L   Potassium 3.7 3.5 - 5.1 mmol/L   Chloride 104 101 - 111 mmol/L   CO2 28 22 - 32 mmol/L   Glucose, Bld 98 65 - 99 mg/dL   BUN 15 6 - 20 mg/dL   Creatinine, Ser 1.610.99 0.61 - 1.24 mg/dL   Calcium 8.8 (L) 8.9 - 10.3 mg/dL   GFR calc non Af Amer >60 >60 mL/min   GFR calc Af Amer >60 >60 mL/min   Anion gap 8 5 - 15  CBC     Status: None   Collection Time: 05/08/17  7:00 PM  Result Value Ref Range   WBC 6.1 3.8 - 10.6 K/uL   RBC 5.07 4.40 - 5.90 MIL/uL   Hemoglobin 15.5 13.0 - 18.0 g/dL   HCT 09.646.6 04.540.0 -  52.0 %   MCV 91.8 80.0 - 100.0 fL   MCH 30.6 26.0 - 34.0 pg   MCHC 33.3 32.0 - 36.0 g/dL   RDW 81.113.3 91.411.5 - 78.214.5 %   Platelets 207 150 - 440 K/uL  Troponin I     Status: None   Collection Time: 05/08/17  7:00 PM  Result Value Ref Range   Troponin I <0.03 <0.03 ng/mL   ____________________________________________  EKG My review and personal interpretation at Time: 18:59   Indication: chest pain  Rate: 55  Rhythm: sinus Axis: normal Other: no brugada, wpw, prolonged qt, stemi or pr  depressions ____________________________________________  RADIOLOGY  I personally reviewed all radiographic images ordered to evaluate for the above acute complaints and reviewed radiology reports and findings.  These findings were personally discussed with the patient.  Please see medical record for radiology report.  ____________________________________________   PROCEDURES  Procedure(s) performed:  Procedures    Critical Care performed: no ____________________________________________   INITIAL IMPRESSION / ASSESSMENT AND PLAN / ED COURSE  Pertinent labs & imaging results that were available during my care of the patient were reviewed by me and considered in my medical decision making (see chart for details).  DDX: ACS, pericarditis, esophagitis, boerhaaves, pe, dissection, pna, bronchitis, costochondritis   Troy Pittman is a 26 y.o. who presents to the ED with chest pain as described above. The patient is afebrile and hemodynamically stable. Dispense this is primarily related to stress induced chest pain. Patient is low heart score of essentially 1. His EKG shows no evidence of acute ST elevation or ischemic changes. His troponin is negative. Clinically is not consistent with pericarditis or endocarditis. Chest x-ray is clear. He has no hypoxia. He is low risk by well's criteria and PERC negative.  His abdominal exam is soft and benign. Do not believe this is referred pain from an intra-abdominal source. Patient states that he is undergoing quite a bit of stress with his wife and financial issues. We spent quite a deal of time discussing need for counseling and healthy ways to manage stress. I do feel patient is stable for follow-up with PCP.  Have discussed with the patient and available family all diagnostics and treatments performed thus far and all questions were answered to the best of my ability. The patient demonstrates understanding and agreement with plan.        ____________________________________________   FINAL CLINICAL IMPRESSION(S) / ED DIAGNOSES  Final diagnoses:  Chest pain, unspecified type  Stress      NEW MEDICATIONS STARTED DURING THIS VISIT:  New Prescriptions   No medications on file     Note:  This document was prepared using Dragon voice recognition software and may include unintentional dictation errors.    Willy Eddyobinson, Julissa Browning, MD 05/08/17 2203

## 2017-05-08 NOTE — ED Triage Notes (Signed)
Pt presents with cp today upon awakening from nap. Pt describes pain as burning, and it has happened periodically in the past. Pt alert & oriented with NAD noted.

## 2017-12-24 ENCOUNTER — Other Ambulatory Visit: Payer: Self-pay

## 2017-12-24 ENCOUNTER — Encounter: Payer: Self-pay | Admitting: Emergency Medicine

## 2017-12-24 ENCOUNTER — Emergency Department
Admission: EM | Admit: 2017-12-24 | Discharge: 2017-12-24 | Disposition: A | Discharge status: Home / Self Care | Payer: Self-pay | Attending: Emergency Medicine | Admitting: Emergency Medicine

## 2017-12-24 DIAGNOSIS — H6982 Other specified disorders of Eustachian tube, left ear: Secondary | ICD-10-CM | POA: Insufficient documentation

## 2017-12-24 MED ORDER — FLUTICASONE PROPIONATE 50 MCG/ACT NA SUSP: 2.0000 | Freq: Every day | NASAL | 0 refills | Status: DC

## 2017-12-24 MED ORDER — PREDNISONE 10 MG PO TABS: ORAL_TABLET | ORAL | 0 refills | Status: DC

## 2017-12-24 NOTE — Discharge Instructions (Signed)
Follow-up with Dr. Willeen CassBennett who is the ear nose and throat specialist on-call for [facility] if he continued to have problems.  Begin using Flonase nasal spray 2 sprays each nostril once daily.  Prednisone 3 tablets once a day for the next 3 days.  You may continue taking Tylenol if needed for pain.  Increase fluids.

## 2017-12-24 NOTE — ED Provider Notes (Signed)
[facility] Emergency Department Provider Note  ____________________________________________   First MD Initiated Contact with Patient 12/24/17 1407     (approximate)  I have reviewed the triage vital signs and the nursing notes.   HISTORY  Chief Complaint Otalgia   HPI Troy Pittman is a 27 y.o. male is here with complaint of left ear pain since yesterday.  Patient denies any fever and is unaware of any nasal congestion.  He denies any hearing changes.  There is been no injury.  Patient states this morning he took Tylenol which is helped with the pain.  He rates his pain as 3/10.   History reviewed. No pertinent past medical history.  There are no active problems to display for this patient.   History reviewed. No pertinent surgical history.  Prior to Admission medications   Medication Sig Start Date End Date Taking? Authorizing Provider  fluticasone (FLONASE) 50 MCG/ACT nasal spray Place 2 sprays into both nostrils daily. 12/24/17 12/24/18  Tommi Rumps, PA-C  predniSONE (DELTASONE) 10 MG tablet Take 3 tablets once a day for the next 3 days. 12/24/17   Tommi Rumps, PA-C    Allergies Penicillins  History reviewed. No pertinent family history.  Social History Social History   Tobacco Use  . Smoking status: Never Smoker  . Smokeless tobacco: Never Used  Substance Use Topics  . Alcohol use: No  . Drug use: No    Review of Systems Constitutional: No fever/chills Eyes: No visual changes. ENT: Positive left ear pain.  Negative for sore throat. Cardiovascular: Denies chest pain. Respiratory: Denies shortness of breath. Musculoskeletal: Negative for back pain. Skin: Negative for rash. Neurological: Negative for headaches, focal weakness or numbness. ____________________________________________   PHYSICAL EXAM:  VITAL SIGNS: ED Triage Vitals  Enc Vitals Group     BP 12/24/17 1257 134/90     Pulse Rate 12/24/17 1257 (!)  102     Resp 12/24/17 1257 18     Temp 12/24/17 1257 99.2 F (37.3 C)     Temp Source 12/24/17 1257 Oral     SpO2 12/24/17 1257 98 %     Weight 12/24/17 1256 189 lb (85.7 kg)     Height 12/24/17 1256 6\' 1"  (1.854 m)     Head Circumference --      Peak Flow --      Pain Score 12/24/17 1303 3     Pain Loc --      Pain Edu? --      Excl. in [place]? --    Constitutional: Alert and oriented. Well appearing and in no acute distress. Eyes: Conjunctivae are normal.  Head: Atraumatic. Nose: No congestion/rhinnorhea.  TMs are dull with very poor light reflex.  Right and left TM has visible fluid present.  Patient was unsuccessful in getting his ears to pop. Mouth/Throat: Mucous membranes are moist.  Oropharynx non-erythematous.  Positive posterior drainage. Neck: No stridor.   Hematological/Lymphatic/Immunilogical: No cervical lymphadenopathy. Cardiovascular: Normal rate, regular rhythm. Grossly normal heart sounds.  Good peripheral circulation. Respiratory: Normal respiratory effort.  No retractions. Lungs CTAB. Musculoskeletal: Moves upper and lower extremities without any difficulty and normal gait was noted. Neurologic:  Normal speech and language. No gross focal neurologic deficits are appreciated.  Skin:  Skin is warm, dry and intact. No rash noted. Psychiatric: Mood and affect are normal. Speech and behavior are normal.  ____________________________________________   LABS (all labs ordered are listed, but only abnormal results are displayed)  Labs Reviewed - No data to display  PROCEDURES  Procedure(s) performed: None  Procedures  Critical Care performed: No  ____________________________________________   INITIAL IMPRESSION / ASSESSMENT AND PLAN / ED COURSE Patient was given prescription for Flonase nasal spray 2 sprays in each nostril once a day and prednisone 30 mg once daily for the next 3 days.  He is to follow-up with Dr. Willeen CassBennett who is on-call for ENT if any continued  problems with his ear.  ____________________________________________   FINAL CLINICAL IMPRESSION(S) / ED DIAGNOSES  Final diagnoses:  Eustachian tube dysfunction, left     ED Discharge Orders        Ordered    fluticasone (FLONASE) 50 MCG/ACT nasal spray  Daily     12/24/17 1510    predniSONE (DELTASONE) 10 MG tablet     12/24/17 1510       Note:  This document was prepared using Dragon voice recognition software and may include unintentional dictation errors.    Tommi RumpsSummers, Haze Antillon L, PA-C 12/24/17 1633    Nita SickleVeronese, [place], MD 12/25/17 1501

## 2017-12-24 NOTE — ED Triage Notes (Signed)
Here for left ear pain since yesterday. No fevers.  No drainage.  Ambulatory. vSS

## 2018-07-13 IMAGING — CR DG CHEST 2V
2 series · 2 of 2 positions shown · non-contrast
Comparison: 04/18/2012

CLINICAL DATA: Cough, weakness, and fatigue.  Muscle aches.

EXAM:
CHEST  2 VIEW

[chest pa]
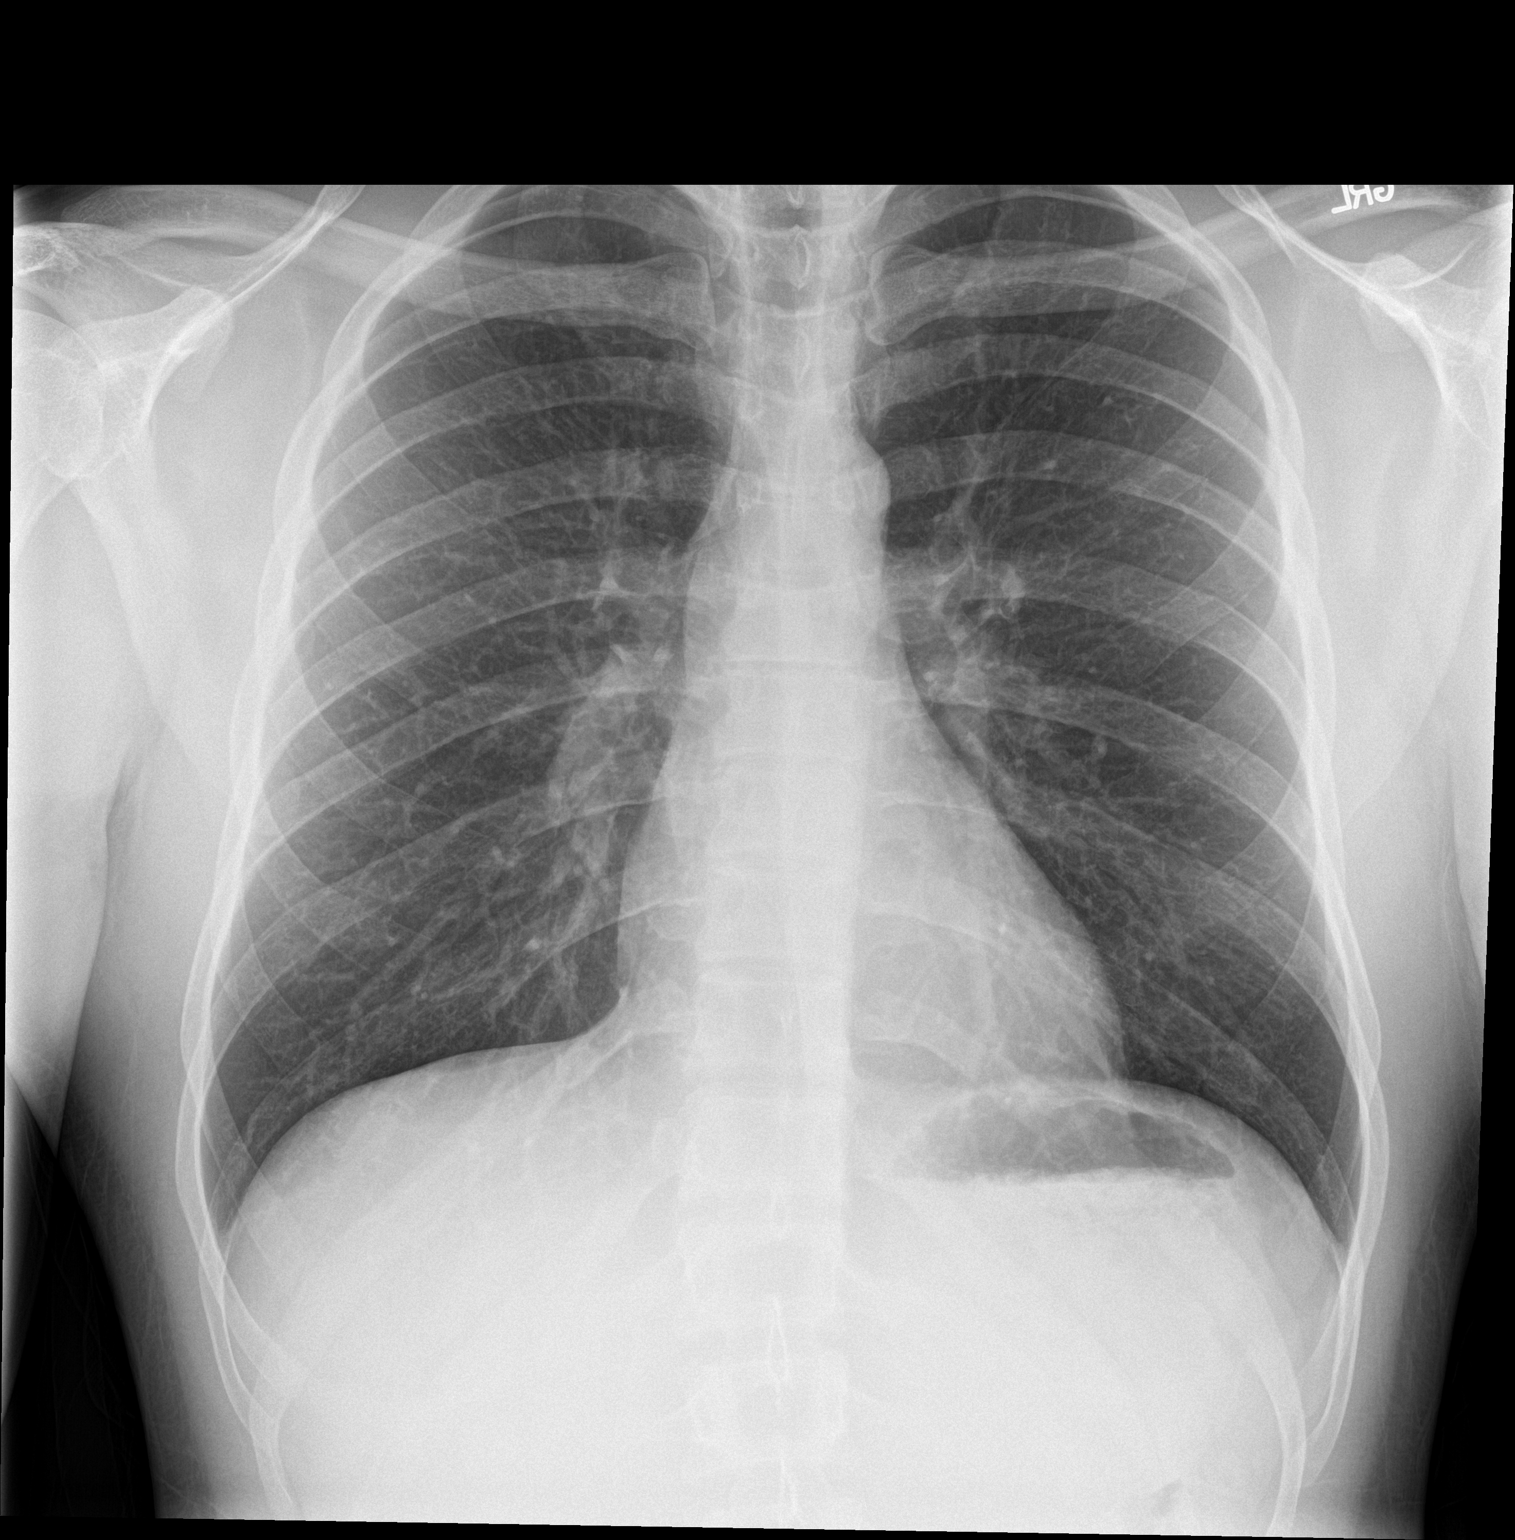

[chest lat]
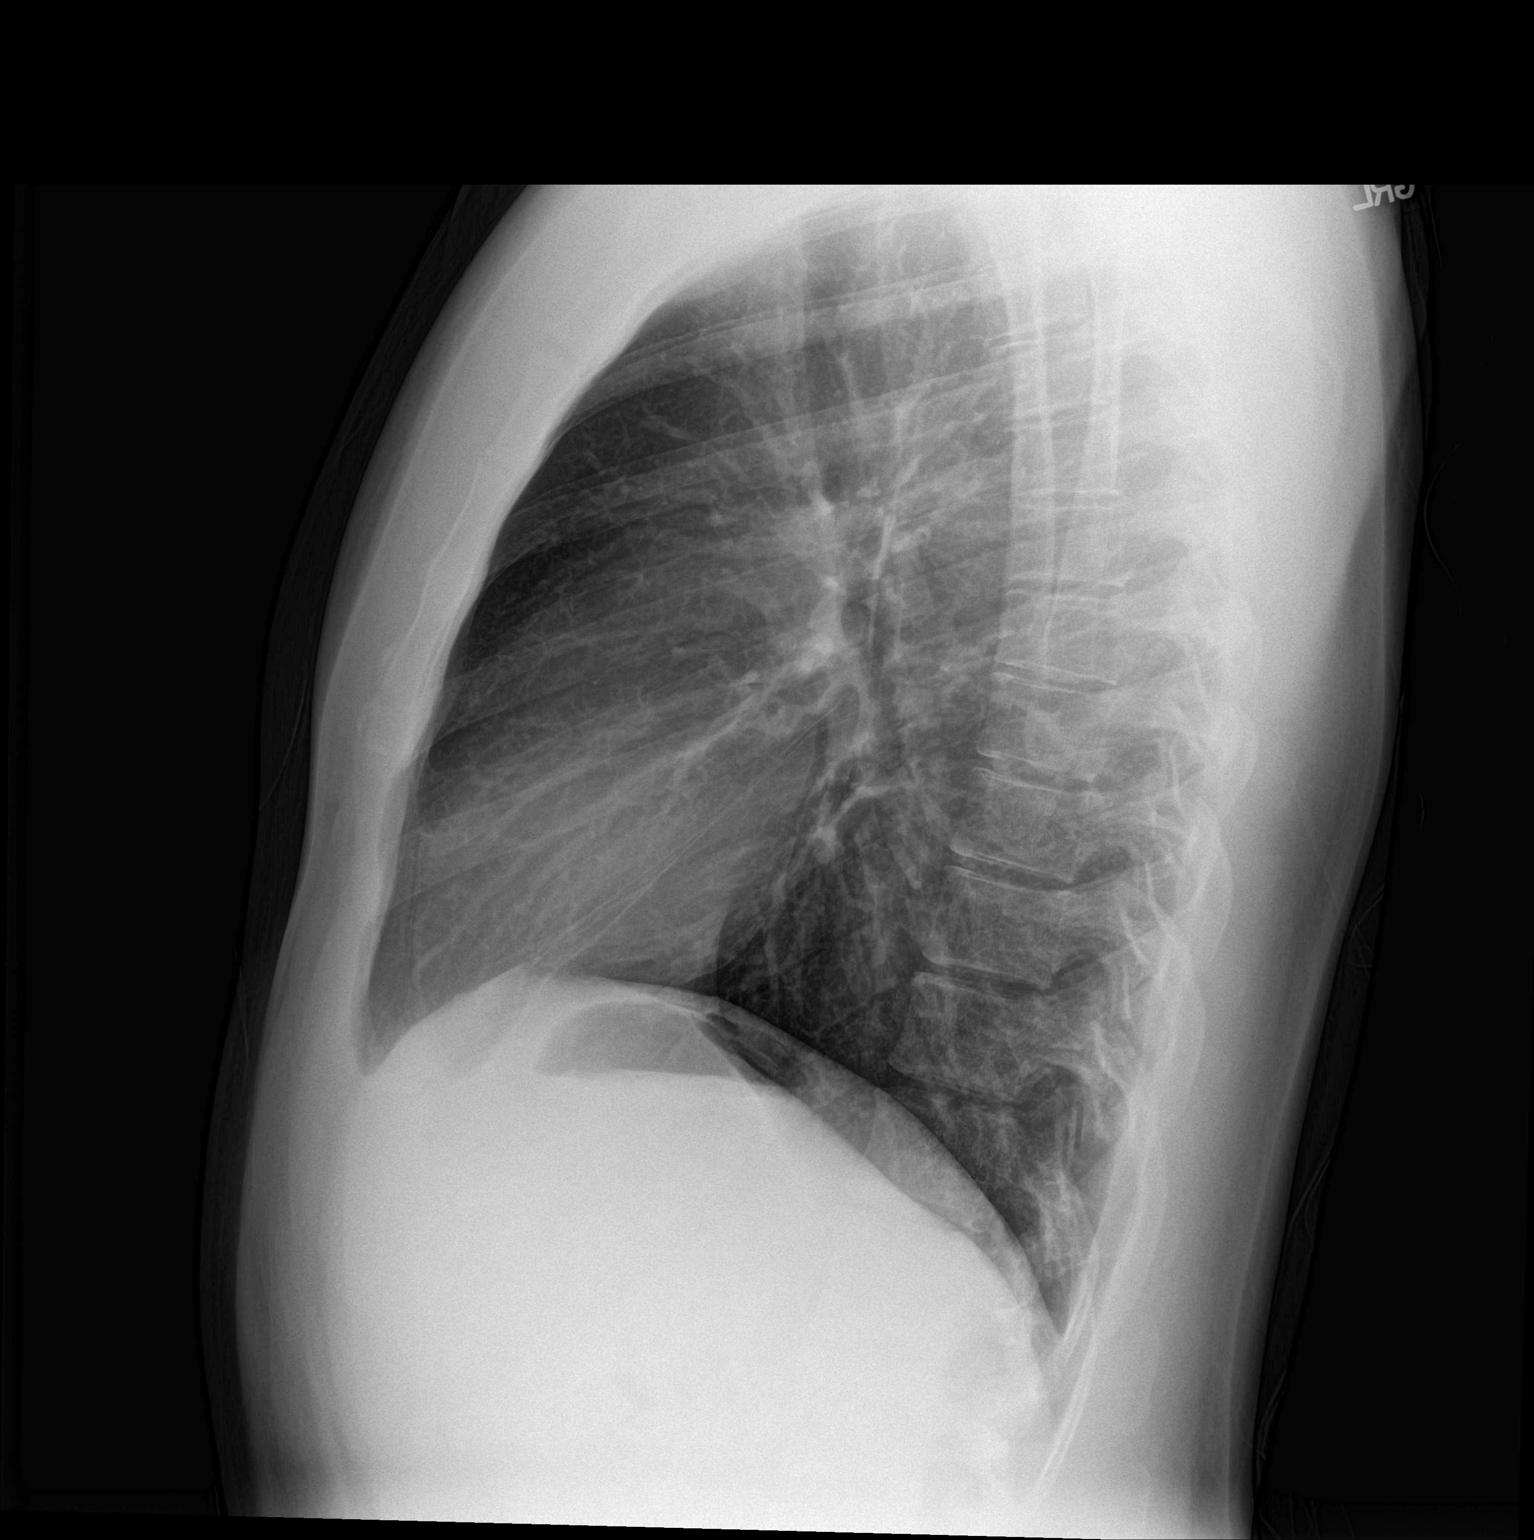

[2 of 2 positions shown; findings below may reference images not displayed]

FINDINGS: The heart size and mediastinal contours are within normal limits.
Both lungs are clear. The visualized skeletal structures are
unremarkable.
IMPRESSION: No active cardiopulmonary disease.

## 2019-05-04 IMAGING — CR DG CHEST 2V
1 series · 2 of 2 positions shown · non-contrast
Comparison: July 17, 2016

CLINICAL DATA: Chest pain today

EXAM:
CHEST  2 VIEW

[Series 1: dg chest 2 view · 0.14mm/px · 2 of 2 slices shown]
[im 1/2]
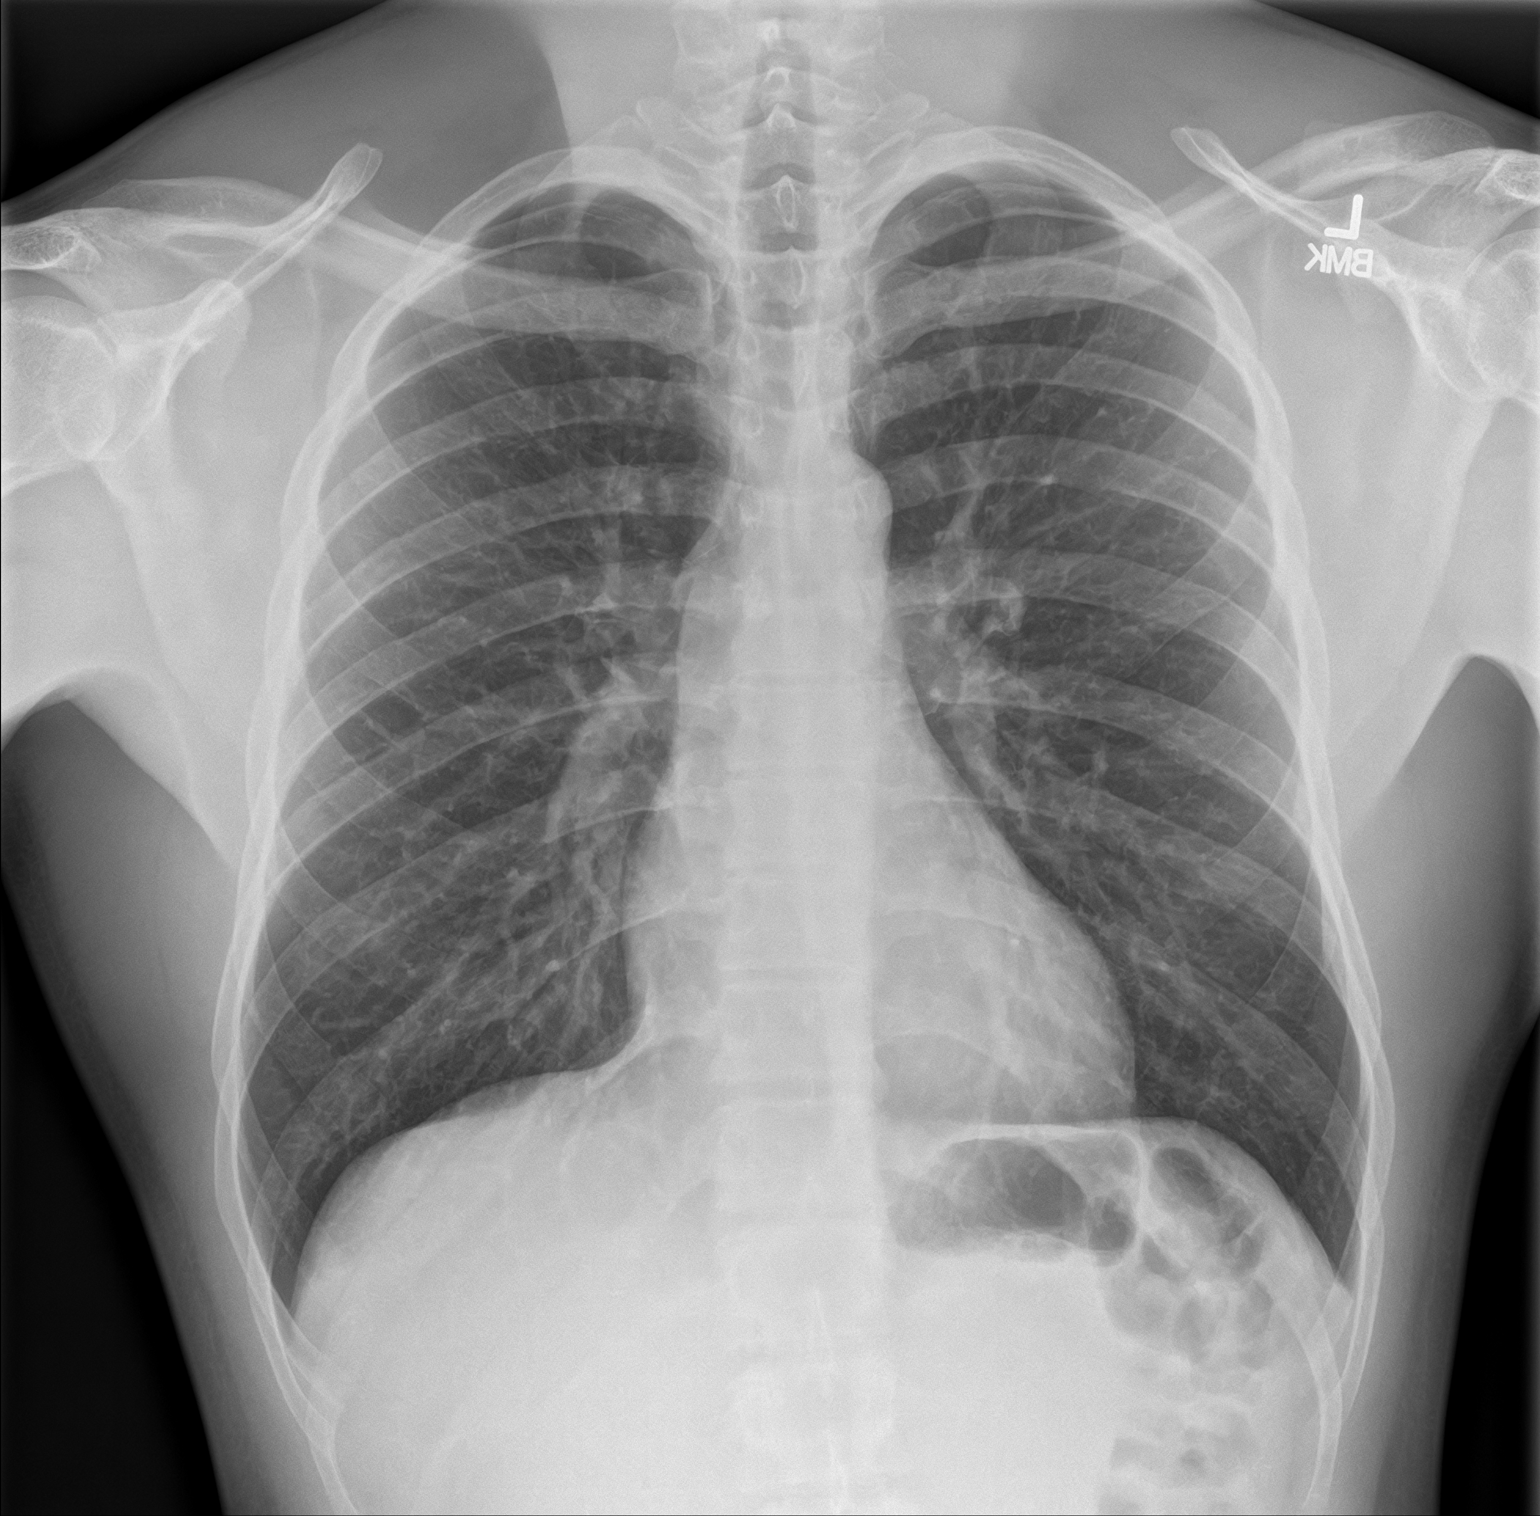
[im 2/2]
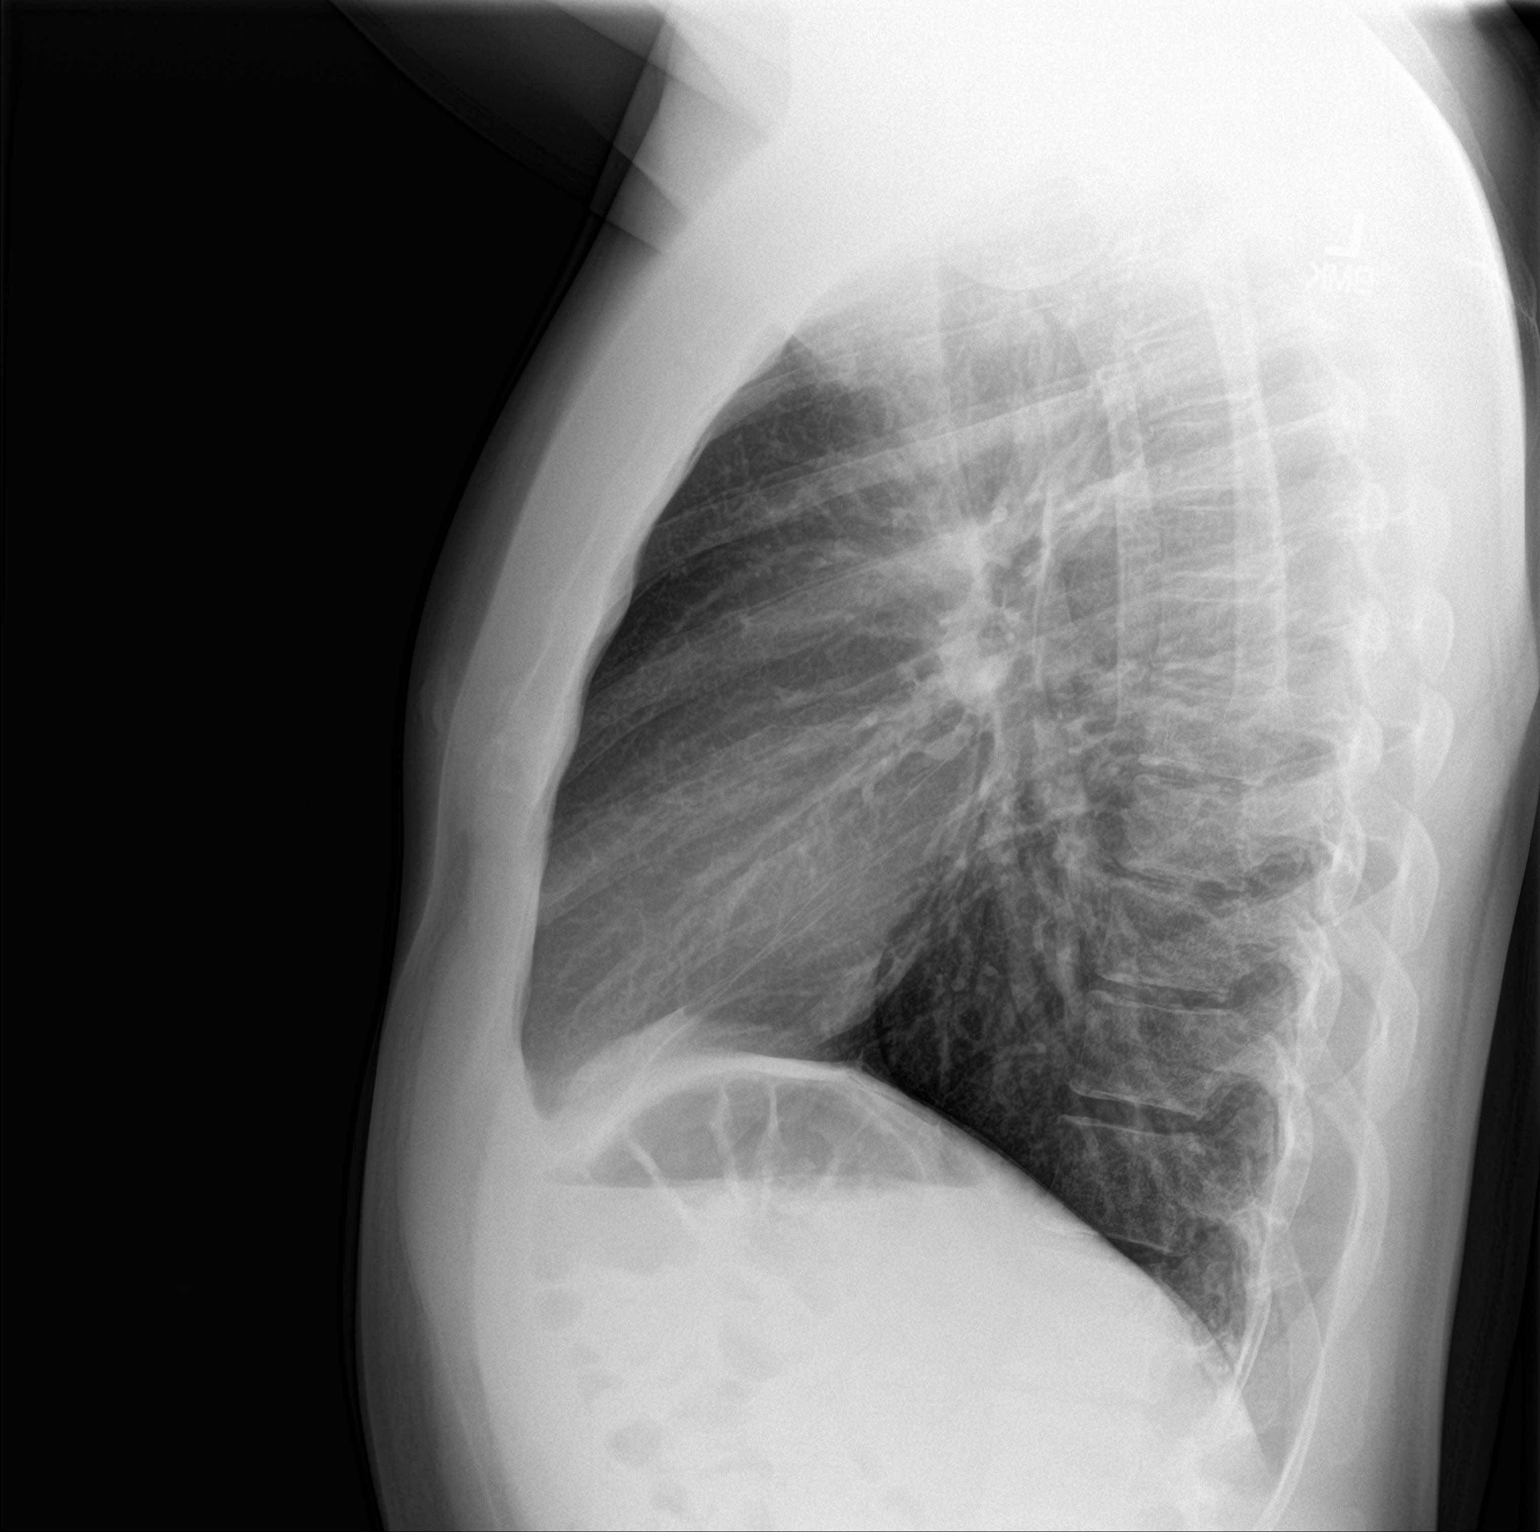

[2 of 2 positions shown; findings below may reference images not displayed]

FINDINGS: The heart size and mediastinal contours are within normal limits.
Both lungs are clear. The visualized skeletal structures are
unremarkable.
IMPRESSION: No active cardiopulmonary disease.

## 2019-06-24 ENCOUNTER — Encounter: Payer: Self-pay | Admitting: Emergency Medicine

## 2019-06-24 ENCOUNTER — Emergency Department
Admission: EM | Admit: 2019-06-24 | Discharge: 2019-06-24 | Disposition: A | Discharge status: Home / Self Care | Payer: Worker's Compensation | Attending: Emergency Medicine | Admitting: Emergency Medicine

## 2019-06-24 ENCOUNTER — Other Ambulatory Visit: Payer: Self-pay

## 2019-06-24 DIAGNOSIS — S0993XA Unspecified injury of face, initial encounter: Secondary | ICD-10-CM | POA: Diagnosis present

## 2019-06-24 DIAGNOSIS — Y9259 Other trade areas as the place of occurrence of the external cause: Secondary | ICD-10-CM | POA: Insufficient documentation

## 2019-06-24 DIAGNOSIS — S0181XA Laceration without foreign body of other part of head, initial encounter: Secondary | ICD-10-CM | POA: Diagnosis not present

## 2019-06-24 DIAGNOSIS — Z23 Encounter for immunization: Secondary | ICD-10-CM | POA: Diagnosis not present

## 2019-06-24 DIAGNOSIS — W2209XA Striking against other stationary object, initial encounter: Secondary | ICD-10-CM | POA: Insufficient documentation

## 2019-06-24 DIAGNOSIS — Y990 Civilian activity done for income or pay: Secondary | ICD-10-CM | POA: Diagnosis not present

## 2019-06-24 DIAGNOSIS — Y939 Activity, unspecified: Secondary | ICD-10-CM | POA: Diagnosis not present

## 2019-06-24 MED ORDER — TETANUS-DIPHTH-ACELL PERTUSSIS 5-2.5-18.5 LF-MCG/0.5 IM SUSP
INTRAMUSCULAR | Status: AC
  Administered 2019-06-24: 06:00:00 0.5 mL via INTRAMUSCULAR
  Filled 2019-06-24: qty 0.5
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
## 2021-09-04 ENCOUNTER — Ambulatory Visit
Admission: EM | Admit: 2021-09-04 | Discharge: 2021-09-04 | Disposition: A | Discharge status: Home / Self Care | Payer: Self-pay | Attending: Family Medicine | Admitting: Family Medicine

## 2021-09-04 ENCOUNTER — Encounter: Payer: Self-pay | Admitting: Emergency Medicine

## 2021-09-04 DIAGNOSIS — R0789 Other chest pain: Secondary | ICD-10-CM

## 2021-09-04 DIAGNOSIS — M791 Myalgia, unspecified site: Secondary | ICD-10-CM

## 2021-09-04 HISTORY — DX: Unspecified asthma, uncomplicated: J45.909

## 2021-09-04 MED ORDER — IBUPROFEN 800 MG PO TABS: 800.0000 mg | ORAL_TABLET | Freq: Two times a day (BID) | ORAL | 0 refills | Status: AC | PRN

## 2021-09-04 NOTE — ED Triage Notes (Signed)
Pt c/o upper back tightness that radiates to the base of his neck x 2 days

## 2021-09-04 NOTE — Discharge Instructions (Addendum)
Your EKG is consistent with previous EKGs.  Suspect chest pain is related to muscle tension that you are having in your neck.  This also can be related to the development of underlying viral illness. For muscle pain take ibuprofen 800 mg twice daily as needed.  Also recommend applying some warm heat as needed.  A low-grade temperature on arrival here at urgent care today.  If your chest pain worsens or becomes severe . Go immediately to the emergency department. He declined viral testing today.  Monitor your temperature at home and if you develop a fever greater than 99.5 managed with Tylenol and ibuprofen and force fluids.

## 2021-09-04 NOTE — ED Provider Notes (Signed)
[facility]    CSN: [id number] Arrival date & time: 09/04/21  1809      History   Chief Complaint Chief Complaint  Patient presents with   Back Pain    HPI Troy Pittman is a 30 y.o. male.   HPI Patient presents today with upper back tightness that radiates to base of his neck x 2 days. Reports experiencing generalized body pain for several months without evaluation and without attempts to alleviate pain with OTC medication. Reports associated chest pain, fatigue,  and pain in his neck. Denies any associated SOB or any known history of cardiac problems. Patient has a low grade fever on arrival , however declines any viral testing.   Past Medical History:  Diagnosis Date   Asthma     There are no problems to display for this patient.   History reviewed. No pertinent surgical history.     Home Medications    Prior to Admission medications   Medication Sig Start Date End Date Taking? Authorizing Provider  ibuprofen (ADVIL) 800 MG tablet Take 1 tablet (800 mg total) by mouth 2 (two) times daily as needed. 09/04/21  Yes Bing Neighbors, FNP  fluticasone (FLONASE) 50 MCG/ACT nasal spray Place 2 sprays into both nostrils daily. 12/24/17 12/24/18  Tommi Rumps, PA-C  predniSONE (DELTASONE) 10 MG tablet Take 3 tablets once a day for the next 3 days. 12/24/17   Tommi Rumps, PA-C    Family History History reviewed. No pertinent family history.  Social History Social History   Tobacco Use   Smoking status: Never   Smokeless tobacco: Never  Vaping Use   Vaping Use: Former  Substance Use Topics   Alcohol use: Yes   Drug use: No     Allergies   Albuterol and Penicillins   Review of Systems Review of Systems Pertinent negatives listed in HPI   Physical Exam Triage Vital Signs ED Triage Vitals  Enc Vitals Group     BP 09/04/21 1858 133/87     Pulse Rate 09/04/21 1858 66     Resp --      Temp 09/04/21 1858 99.1 F (37.3 C)      Temp Source 09/04/21 1858 Oral     SpO2 09/04/21 1858 98 %     Weight --      Height --      Head Circumference --      Peak Flow --      Pain Score 09/04/21 1856 3     Pain Loc --      Pain Edu? --      Excl. in [place]? --    No data found.  Updated Vital Signs BP 133/87 (BP Location: Left Arm)   Pulse 66   Temp 99.1 F (37.3 C) (Oral)   SpO2 98%   Visual Acuity Right Eye Distance:   Left Eye Distance:   Bilateral Distance:    Right Eye Near:   Left Eye Near:    Bilateral Near:     Physical Exam Vitals reviewed.  Constitutional:      Appearance: Normal appearance.  Cardiovascular:     Rate and Rhythm: Normal rate and regular rhythm.  Pulmonary:     Effort: Pulmonary effort is normal.     Breath sounds: Normal breath sounds and air entry.  Musculoskeletal:        General: Normal range of motion.     Cervical back: Normal range of motion. No  rigidity.  Skin:    General: Skin is warm and dry.  Neurological:     General: No focal deficit present.     Mental Status: He is alert.  Psychiatric:        Mood and Affect: Mood normal.        Speech: Speech normal.     UC Treatments / Results  Labs (all labs ordered are listed, but only abnormal results are displayed) Labs Reviewed - No data to display  EKG Sinus Bradycardia , 56, no ST-Changes   Radiology No results found.  Procedures Procedures (including critical care time)  Medications Ordered in UC Medications - No data to display  Initial Impression / Assessment and Plan / UC Course  I have reviewed the triage vital signs and the nursing notes.  Pertinent labs & imaging results that were available during my care of the patient were reviewed by me and considered in my medical decision making (see chart for details).     Myalgia with atypical chest pain, no concern for ACS. Patient exam is grossly intact. ECG is unremarkable.  Recommend trial if ibuprofen. Establish with PCP to schedule a complete   physical   Final Clinical Impressions(s) / UC Diagnoses   Final diagnoses:  Myalgia  Atypical chest pain     Discharge Instructions      Your EKG is consistent with previous EKGs.  Suspect chest pain is related to muscle tension that you are having in your neck.  This also can be related to the development of underlying viral illness. For muscle pain take ibuprofen 800 mg twice daily as needed.  Also recommend applying some warm heat as needed.  A low-grade temperature on arrival here at urgent care today.  If your chest pain worsens or becomes severe . Go immediately to the emergency department. He declined viral testing today.  Monitor your temperature at home and if you develop a fever greater than 99.5 managed with Tylenol and ibuprofen and force fluids.     ED Prescriptions     Medication Sig Dispense Auth. Provider   ibuprofen (ADVIL) 800 MG tablet Take 1 tablet (800 mg total) by mouth 2 (two) times daily as needed. 21 tablet Bing Neighbors, FNP      PDMP not reviewed this encounter.   Bing Neighbors, FNP 09/12/21 0000

## 2023-09-10 ENCOUNTER — Ambulatory Visit: Payer: Medicaid Other | Admitting: Physician Assistant

## 2023-12-08 ENCOUNTER — Ambulatory Visit: Payer: Self-pay | Admitting: Physician Assistant

## 2024-03-24 ENCOUNTER — Ambulatory Visit: Admitting: Student

## 2024-03-24 ENCOUNTER — Other Ambulatory Visit: Payer: Self-pay

## 2024-03-29 ENCOUNTER — Encounter: Payer: Self-pay | Admitting: General Practice

## 2024-05-19 ENCOUNTER — Ambulatory Visit: Admitting: Physician Assistant

## 2024-05-19 ENCOUNTER — Encounter: Payer: Self-pay | Admitting: Physician Assistant

## 2024-05-19 VITALS — BP 110/82 | HR 55 | Temp 97.9°F | Ht 73.0 in | Wt 219.0 lb

## 2024-05-19 DIAGNOSIS — Z0000 Encounter for general adult medical examination without abnormal findings: Secondary | ICD-10-CM

## 2024-05-19 DIAGNOSIS — Z113 Encounter for screening for infections with a predominantly sexual mode of transmission: Secondary | ICD-10-CM | POA: Diagnosis not present

## 2024-05-19 NOTE — Patient Instructions (Signed)

## 2024-05-19 NOTE — Progress Notes (Signed)
 Date:  05/19/2024   Name:  Troy Pittman   DOB:  1991-08-31   MRN:  [id number]   Chief Complaint: Establish Care and Annual Exam  HPI Troy Pittman is a very pleasant 33 y.o. male with no significant PMH who presents new to the clinic today to establish care. He has no specific complaints but would like baseline lab work. He did have cholecystitis in 2013, says they removed the stone but not the gallbladder. I do not have record of this.   Last Physical: 10+ years ago Last Dental Exam: 1y ago Last Eye Exam: 10+ years ago Immunizations Due: unknown (from [place]). Does not know prior peds office.       Medication list has been reviewed and updated.  Current Meds  Medication Sig   ibuprofen  (ADVIL ) 800 MG tablet Take 1 tablet (800 mg total) by mouth 2 (two) times daily as needed.     Review of Systems  There are no active problems to display for this patient.   Allergies  Allergen Reactions   Albuterol    Penicillins     Immunization History  Administered Date(s) Administered   Tdap 06/24/2019    Past Surgical History:  Procedure Laterality Date   BLADDER STONE REMOVAL     gal stone removal    Social History   Tobacco Use   Smoking status: Some Days    Types: Cigars   Smokeless tobacco: Never   Tobacco comments:    Once every 3 year cigars   Vaping Use   Vaping status: Former   Quit date: 10/14/2014   Substances: Nicotine  Substance Use Topics   Alcohol use: Yes    Comment: socially   Drug use: No    Family History  Problem Relation Age of Onset   Heart disease Mother    Diabetes Mother    Diabetes Father    Stroke Maternal Grandfather    Heart disease Maternal Grandfather    Heart attack Maternal Grandfather    Hypertension Paternal Grandmother         05/19/2024   10:14 AM  GAD 7 : Generalized Anxiety Score  Nervous, Anxious, on Edge 0  Control/stop worrying 0  Worry too much - different things 0  Trouble relaxing 1  Restless 1  Easily annoyed  or irritable 1  Afraid - awful might happen 0  Total GAD 7 Score 3  Anxiety Difficulty Not difficult at all       05/19/2024   10:14 AM  Depression screen PHQ 2/9  Decreased Interest 2  Down, Depressed, Hopeless 0  PHQ - 2 Score 2  Altered sleeping 2  Tired, decreased energy 2  Change in appetite 0  Feeling bad or failure about yourself  0  Trouble concentrating 1  Moving slowly or fidgety/restless 0  Suicidal thoughts 0  PHQ-9 Score 7  Difficult doing work/chores Not difficult at all    BP Readings from Last 3 Encounters:  05/19/24 110/82  09/04/21 133/87  06/24/19 (!) 142/76    Wt Readings from Last 3 Encounters:  05/19/24 219 lb (99.3 kg)  12/24/17 189 lb (85.7 kg)  05/08/17 185 lb (83.9 kg)    BP 110/82   Pulse (!) 55   Temp 97.9 F (36.6 C)   Ht 6' 1 (1.854 m)   Wt 219 lb (99.3 kg)   SpO2 97%   BMI 28.89 kg/m   Physical Exam Vitals and nursing note reviewed.  Constitutional:  Appearance: Normal appearance.  HENT:     Ears:     Comments: EAC clear bilaterally with good view of TM which is without effusion or erythema.     Nose: Nose normal.     Mouth/Throat:     Mouth: Mucous membranes are moist. No oral lesions.     Dentition: Normal dentition.     Pharynx: No posterior oropharyngeal erythema.  Eyes:     Extraocular Movements: Extraocular movements intact.     Conjunctiva/sclera: Conjunctivae normal.     Pupils: Pupils are equal, round, and reactive to light.  Neck:     Thyroid: No thyromegaly.  Cardiovascular:     Rate and Rhythm: Normal rate and regular rhythm.     Heart sounds: No murmur heard.    No friction rub. No gallop.     Comments: Pulses 2+ at radial, PT, DP bilaterally. No carotid bruit. No peripheral edema Pulmonary:     Effort: Pulmonary effort is normal.     Breath sounds: Normal breath sounds.  Abdominal:     General: Bowel sounds are normal.     Palpations: Abdomen is soft. There is no mass.     Tenderness: There is  no abdominal tenderness.  Genitourinary:    Comments: Genital/rectal exam deferred. Reviewed technique for testicular self-exam. Musculoskeletal:     Comments: Full ROM with strength 5/5 bilateral upper and lower extremities  Lymphadenopathy:     Cervical: No cervical adenopathy.  Skin:    General: Skin is warm.     Capillary Refill: Capillary refill takes less than 2 seconds.     Findings: No lesion or rash.  Neurological:     Mental Status: He is alert and oriented to person, place, and time.     Gait: Gait is intact.  Psychiatric:        Mood and Affect: Mood normal.        Behavior: Behavior normal.     Recent Labs     Component Value Date/Time   NA 140 05/08/2017 1900   NA 139 01/21/2012 1018   K 3.7 05/08/2017 1900   K 4.4 01/21/2012 1018   CL 104 05/08/2017 1900   CL 101 01/21/2012 1018   CO2 28 05/08/2017 1900   CO2 31 01/21/2012 1018   GLUCOSE 98 05/08/2017 1900   GLUCOSE 93 01/21/2012 1018   BUN 15 05/08/2017 1900   BUN 10 01/21/2012 1018   CREATININE 0.99 05/08/2017 1900   CREATININE 0.98 01/21/2012 1018   CALCIUM 8.8 (L) 05/08/2017 1900   CALCIUM 9.3 01/21/2012 1018   PROT 7.2 07/17/2016 2113   PROT 6.6 01/23/2012 0503   ALBUMIN 3.9 07/17/2016 2113   ALBUMIN 3.3 (L) 01/23/2012 0503   AST 16 07/17/2016 2113   AST 383 (H) 01/23/2012 0503   ALT 11 (L) 07/17/2016 2113   ALT 672 (H) 01/23/2012 0503   ALKPHOS 87 07/17/2016 2113   ALKPHOS 216 (H) 01/23/2012 0503   BILITOT 0.6 07/17/2016 2113   BILITOT 1.5 (H) 01/23/2012 0503   GFRNONAA >60 05/08/2017 1900   GFRNONAA >60 01/21/2012 1018   GFRAA >60 05/08/2017 1900   GFRAA >60 01/21/2012 1018    Lab Results  Component Value Date   WBC 6.1 05/08/2017   HGB 15.5 05/08/2017   HCT 46.6 05/08/2017   MCV 91.8 05/08/2017   PLT 207 05/08/2017   No results found for: HGBA1C No results found for: CHOL, HDL, LDLCALC, LDLDIRECT, TRIG, CHOLHDL No results found for: TSH  Assessment and  Plan:  1. Annual physical exam (Primary) Encouraged healthy lifestyle including regular physical activity and consumption of whole fruits and vegetables. Encouraged routine dental and eye exams.   - Lipid panel - CBC with Differential/Platelet - Comprehensive metabolic panel with GFR - TSH  2. Screening examination for STI Asymptomatic screening panel ordered.   - GC/Chlamydia Probe Amp - RPR - HIV Antibody (routine testing w rflx) - Hepatitis B surface antibody,qualitative - Hepatitis B surface antigen - Hepatitis B core antibody, total - Hepatitis C antibody    Return in about 1 year (around 05/19/2025) for CPE.    Rolan Hoyle, PA-C, DMSc, Nutritionist [facility] Sports [facility] [phone]

## 2024-05-20 ENCOUNTER — Ambulatory Visit: Payer: Self-pay | Admitting: Physician Assistant

## 2024-05-20 LAB — RPR: RPR Ser Ql: NONREACTIVE

## 2024-05-20 LAB — CBC WITH DIFFERENTIAL/PLATELET
Basophils Absolute: 0.1 x10E3/uL (ref 0.0–0.2)
Basos: 1 %
EOS (ABSOLUTE): 0.4 x10E3/uL (ref 0.0–0.4)
Eos: 8 %
Hematocrit: 49.3 % (ref 37.5–51.0)
Hemoglobin: 15.8 g/dL (ref 13.0–17.7)
Immature Grans (Abs): 0 x10E3/uL (ref 0.0–0.1)
Immature Granulocytes: 0 %
Lymphocytes Absolute: 1.4 x10E3/uL (ref 0.7–3.1)
Lymphs: 27 %
MCH: 29.6 pg (ref 26.6–33.0)
MCHC: 32 g/dL (ref 31.5–35.7)
MCV: 93 fL (ref 79–97)
Monocytes Absolute: 0.5 x10E3/uL (ref 0.1–0.9)
Monocytes: 9 %
Neutrophils Absolute: 2.9 x10E3/uL (ref 1.4–7.0)
Neutrophils: 55 %
Platelets: 247 x10E3/uL (ref 150–450)
RBC: 5.33 x10E6/uL (ref 4.14–5.80)
RDW: 12.7 % (ref 11.6–15.4)
WBC: 5.2 x10E3/uL (ref 3.4–10.8)

## 2024-05-20 LAB — COMPREHENSIVE METABOLIC PANEL WITH GFR
ALT: 29 IU/L (ref 0–44)
AST: 26 IU/L (ref 0–40)
Albumin: 4.5 g/dL (ref 4.1–5.1)
Alkaline Phosphatase: 68 IU/L (ref 44–121)
BUN/Creatinine Ratio: 11 (ref 9–20)
BUN: 11 mg/dL (ref 6–20)
Bilirubin Total: 0.5 mg/dL (ref 0.0–1.2)
CO2: 24 mmol/L (ref 20–29)
Calcium: 9.6 mg/dL (ref 8.7–10.2)
Chloride: 101 mmol/L (ref 96–106)
Creatinine, Ser: 1.03 mg/dL (ref 0.76–1.27)
Globulin, Total: 2.5 g/dL (ref 1.5–4.5)
Glucose: 89 mg/dL (ref 70–99)
Potassium: 4.7 mmol/L (ref 3.5–5.2)
Sodium: 140 mmol/L (ref 134–144)
Total Protein: 7 g/dL (ref 6.0–8.5)
eGFR: 99 mL/min/1.73 (ref >59)

## 2024-05-20 LAB — HEPATITIS B SURFACE ANTIBODY,QUALITATIVE: Hep B Surface Ab, Qual: NONREACTIVE

## 2024-05-20 LAB — HIV ANTIBODY (ROUTINE TESTING W REFLEX): HIV Screen 4th Generation wRfx: NONREACTIVE

## 2024-05-20 LAB — HEPATITIS B SURFACE ANTIGEN: Hepatitis B Surface Ag: NEGATIVE

## 2024-05-20 LAB — TSH: TSH: 3.02 u[IU]/mL (ref 0.450–4.500)

## 2024-05-20 LAB — LIPID PANEL
Chol/HDL Ratio: 3.2 ratio (ref 0.0–5.0)
Cholesterol, Total: 170 mg/dL (ref 100–199)
HDL: 53 mg/dL (ref >39)
LDL Chol Calc (NIH): 109 mg/dL — ABNORMAL HIGH (ref 0–99)
Triglycerides: 40 mg/dL (ref 0–149)
VLDL Cholesterol Cal: 8 mg/dL (ref 5–40)

## 2024-05-20 LAB — HEPATITIS C ANTIBODY: Hep C Virus Ab: NONREACTIVE

## 2024-05-20 LAB — HEPATITIS B CORE ANTIBODY, TOTAL: Hep B Core Total Ab: NEGATIVE

## 2024-05-21 LAB — GC/CHLAMYDIA PROBE AMP
Chlamydia trachomatis, NAA: NEGATIVE
Neisseria Gonorrhoeae by PCR: NEGATIVE

## 2025-05-20 ENCOUNTER — Encounter: Admitting: Physician Assistant
